# Patient Record
Sex: Female | Born: 1995 | Race: Black or African American | Hispanic: No | Marital: Single | Smoking: Never smoker
Health system: Southern US, Community
[De-identification: ages and names within clinical notes are randomized; demographics above are authoritative.]

## PROBLEM LIST (undated history)

## (undated) ENCOUNTER — Inpatient Hospital Stay (HOSPITAL_COMMUNITY): Payer: Self-pay

## (undated) DIAGNOSIS — F419 Anxiety disorder, unspecified: Secondary | ICD-10-CM

## (undated) DIAGNOSIS — T7840XA Allergy, unspecified, initial encounter: Secondary | ICD-10-CM

## (undated) DIAGNOSIS — K219 Gastro-esophageal reflux disease without esophagitis: Secondary | ICD-10-CM

## (undated) DIAGNOSIS — F32A Depression, unspecified: Secondary | ICD-10-CM

## (undated) HISTORY — DX: Depression, unspecified: F32.A

## (undated) HISTORY — DX: Anxiety disorder, unspecified: F41.9

---
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
## 2016-01-01 ENCOUNTER — Telehealth (HOSPITAL_BASED_OUTPATIENT_CLINIC_OR_DEPARTMENT_OTHER): Payer: Self-pay | Admitting: Emergency Medicine

## 2016-01-07 MED FILL — Fluorescein Sodium Ophth Strips 1 MG: OPHTHALMIC | Qty: 2 | Status: AC

## 2016-02-04 ENCOUNTER — Encounter (HOSPITAL_COMMUNITY): Payer: Self-pay

## 2016-02-04 ENCOUNTER — Emergency Department (HOSPITAL_COMMUNITY)
Admission: EM | Admit: 2016-02-04 | Discharge: 2016-02-04 | Disposition: A | Discharge status: Home / Self Care | Payer: Medicaid Other | Attending: Emergency Medicine | Admitting: Emergency Medicine

## 2016-02-04 DIAGNOSIS — Z87891 Personal history of nicotine dependence: Secondary | ICD-10-CM | POA: Insufficient documentation

## 2016-02-04 DIAGNOSIS — Z5321 Procedure and treatment not carried out due to patient leaving prior to being seen by health care provider: Secondary | ICD-10-CM | POA: Insufficient documentation

## 2016-02-04 DIAGNOSIS — R05 Cough: Secondary | ICD-10-CM | POA: Diagnosis not present

## 2016-02-04 DIAGNOSIS — R1110 Vomiting, unspecified: Secondary | ICD-10-CM | POA: Insufficient documentation

## 2016-02-04 LAB — COMPREHENSIVE METABOLIC PANEL
ALBUMIN: 3.8 g/dL (ref 3.5–5.0)
ALT: 26 U/L (ref 14–54)
ANION GAP: 15 (ref 5–15)
AST: 36 U/L (ref 15–41)
Alkaline Phosphatase: 79 U/L (ref 38–126)
BUN: 8 mg/dL (ref 6–20)
CO2: 21 mmol/L — AB (ref 22–32)
Calcium: 9.3 mg/dL (ref 8.9–10.3)
Chloride: 105 mmol/L (ref 101–111)
Creatinine, Ser: 0.88 mg/dL (ref 0.44–1.00)
GFR calc non Af Amer: >60 mL/min (ref >60)
GLUCOSE: 126 mg/dL — AB (ref 65–99)
POTASSIUM: 3.5 mmol/L (ref 3.5–5.1)
SODIUM: 141 mmol/L (ref 135–145)
TOTAL PROTEIN: 7.3 g/dL (ref 6.5–8.1)
Total Bilirubin: 0.6 mg/dL (ref 0.3–1.2)

## 2016-02-04 LAB — CBC
HEMATOCRIT: 41.6 % (ref 36.0–46.0)
HEMOGLOBIN: 13.1 g/dL (ref 12.0–15.0)
MCH: 27.5 pg (ref 26.0–34.0)
MCHC: 31.5 g/dL (ref 30.0–36.0)
MCV: 87.4 fL (ref 78.0–100.0)
Platelets: 335 10*3/uL (ref 150–400)
RBC: 4.76 MIL/uL (ref 3.87–5.11)
RDW: 14.4 % (ref 11.5–15.5)
WBC: 16.2 10*3/uL — ABNORMAL HIGH (ref 4.0–10.5)

## 2016-02-04 LAB — LIPASE, BLOOD: Lipase: 16 U/L (ref 11–51)

## 2016-02-04 NOTE — ED Notes (Signed)
Pts name called for a room no answer 

## 2016-02-04 NOTE — ED Triage Notes (Signed)
Pt reports cough that started today. She reports an episode of coughing up blood. She also reports one episode of emesis. Resp e/u.

## 2016-02-05 ENCOUNTER — Encounter (HOSPITAL_COMMUNITY): Payer: Self-pay | Admitting: Emergency Medicine

## 2016-02-05 ENCOUNTER — Emergency Department (HOSPITAL_COMMUNITY): Payer: Medicaid Other

## 2016-02-05 ENCOUNTER — Emergency Department (HOSPITAL_COMMUNITY)
Admission: EM | Admit: 2016-02-05 | Discharge: 2016-02-06 | Disposition: A | Discharge status: Home / Self Care | Payer: Medicaid Other | Attending: Emergency Medicine | Admitting: Emergency Medicine

## 2016-02-05 DIAGNOSIS — R079 Chest pain, unspecified: Secondary | ICD-10-CM | POA: Diagnosis present

## 2016-02-05 DIAGNOSIS — R042 Hemoptysis: Secondary | ICD-10-CM | POA: Diagnosis not present

## 2016-02-05 DIAGNOSIS — Z87891 Personal history of nicotine dependence: Secondary | ICD-10-CM | POA: Insufficient documentation

## 2016-02-05 DIAGNOSIS — R059 Cough, unspecified: Secondary | ICD-10-CM

## 2016-02-05 DIAGNOSIS — R05 Cough: Secondary | ICD-10-CM

## 2016-02-05 DIAGNOSIS — R0789 Other chest pain: Secondary | ICD-10-CM

## 2016-02-05 DIAGNOSIS — R1010 Upper abdominal pain, unspecified: Secondary | ICD-10-CM | POA: Insufficient documentation

## 2016-02-05 LAB — I-STAT TROPONIN, ED: TROPONIN I, POC: 0.01 ng/mL (ref 0.00–0.08)

## 2016-02-05 LAB — BASIC METABOLIC PANEL
Anion gap: 7 (ref 5–15)
BUN: 6 mg/dL (ref 6–20)
CALCIUM: 9.1 mg/dL (ref 8.9–10.3)
CO2: 25 mmol/L (ref 22–32)
Chloride: 106 mmol/L (ref 101–111)
Creatinine, Ser: 0.62 mg/dL (ref 0.44–1.00)
GFR calc Af Amer: >60 mL/min (ref >60)
Glucose, Bld: 95 mg/dL (ref 65–99)
POTASSIUM: 3.9 mmol/L (ref 3.5–5.1)
SODIUM: 138 mmol/L (ref 135–145)

## 2016-02-05 LAB — CBC
HEMATOCRIT: 42.4 % (ref 36.0–46.0)
Hemoglobin: 13.2 g/dL (ref 12.0–15.0)
MCH: 27.6 pg (ref 26.0–34.0)
MCHC: 31.1 g/dL (ref 30.0–36.0)
MCV: 88.7 fL (ref 78.0–100.0)
PLATELETS: 351 10*3/uL (ref 150–400)
RBC: 4.78 MIL/uL (ref 3.87–5.11)
RDW: 14.4 % (ref 11.5–15.5)
WBC: 13.4 10*3/uL — AB (ref 4.0–10.5)

## 2016-02-05 NOTE — ED Triage Notes (Signed)
Pt. reports central chest pain onset yesterday , denies SOB , no nausea or diaphoresis , occasional productive cough , no fever or chills.

## 2016-02-05 NOTE — ED Provider Notes (Signed)
[facility]-EMER[facility]Y DEPT Provider Note   CSN: [id number] Arrival date & time: 02/05/16  2046  By signing my name below, I, Suzan Slick. Elon Spanner, attest that this documentation has been prepared under the direction and in the presence of Arby Barrette, MD.  Electronically Signed: Suzan Slick. Elon Spanner, ED Scribe. 02/05/16. 11:56 PM.    History   Chief Complaint Chief Complaint  Patient presents with  . Chest Pain   The history is provided by the patient. No language interpreter was used.    HPI Comments: Beth Goodwin is a 20 y.o. female with a PMHx of GERD who presents to the Emergency Department complaining of constant, unchanged worsening lower thoracic back pain bilaterally that radiates around to her upper and central abdomen symmetrically x 2 days. Discomfort is made worse with deep breathing and coughing. No alleviating factors reported. She also reports an ongoing productive cough consisting of thick dark mucous and intermittent streaks of blood x 1 day. No lower extremity swelling or pain. Denies any fever, nausea, vomiting, diarrhea, leg swelling, vaginal bleeding, vaginal discharge. Pt was treated 1 month ago for an STI. States she completed treatment as prescribed. No preceding URI symptoms reported.  PCP: Alma Downs, MD    Past Medical History:  Diagnosis Date  . Allergy   . GERD (gastroesophageal reflux disease)     Patient Active Problem List   Diagnosis Date Noted  . Common bile duct dilatation   . Recurrent biliary colic   . Biliary colic 11/01/2014  . Abdominal pain 11/01/2014  . Postpartum state 09/13/2014  . [redacted] weeks gestation of pregnancy   . Non-reactive NST (non-stress test)   . NST (non-stress test) nonreactive   . [redacted] weeks gestation of pregnancy   . Insufficient weight gain during pregnancy in third trimester, antepartum   . Abdominal pain in pregnancy 06/09/2014  . Obesity affecting pregnancy in second trimester, antepartum   . Insufficient prenatal  care in second trimester 04/25/2014  . Encounter for supervision of normal pregnancy in teen primigravida, antepartum 04/25/2014    Past Surgical History:  Procedure Laterality Date  . CESAREAN SECTION Beth Goodwin 09/10/2014   Procedure: CESAREAN SECTION;  Surgeon: Catalina Antigua, MD;  Location: [facility];  Service: Obstetrics;  Laterality: Beth Goodwin;  . CHOLECYSTECTOMY Beth Goodwin 11/02/2014   Procedure: LAPAROSCOPIC CHOLECYSTECTOMY WITH INTRAOPERATIVE CHOLANGIOGRAM;  Surgeon: Chevis Pretty III, MD;  Location: [facility] OR;  Service: General;  Laterality: Beth Goodwin;    OB History    Gravida Para Term Preterm AB Living   1 1 1     1    SAB TAB Ectopic Multiple Live Births         0 1       Home Medications    Prior to Admission medications   Medication Sig Start Date End Date Taking? Authorizing Provider  etonogestrel (NEXPLANON) 68 MG IMPL implant 1 each by Subdermal route once.   Yes Historical Provider, MD  azithromycin (ZITHROMAX) 250 MG tablet Take 1 tablet (250 mg total) by mouth daily. 02/06/16   Arby Barrette, MD  ibuprofen (ADVIL,MOTRIN) 600 MG tablet Take 1 tablet (600 mg total) by mouth every 6 (six) hours as needed. 02/06/16   Arby Barrette, MD    Family History Family History  Problem Relation Age of Onset  . Hypertension Father   . Diabetes Maternal Aunt   . Diabetes Maternal Grandmother   . Diabetes Paternal Grandmother     Social History Social History  Substance Use Topics  . Smoking status:  Former Smoker  . Smokeless tobacco: Never Used  . Alcohol use No     Allergies   Amoxicillin   Review of Systems Review of Systems  Constitutional: Negative for fever.  Respiratory: Positive for cough.   Cardiovascular: Negative for leg swelling.  Gastrointestinal: Positive for abdominal pain.  Genitourinary: Negative for vaginal bleeding and vaginal discharge.  Musculoskeletal: Positive for back pain.  All other systems reviewed and are negative.    Physical Exam Updated Vital Signs BP  115/70   Pulse 94   Temp 98.1 F (36.7 C) (Oral)   Resp 26   Ht 5\' 5"  (1.651 m)   LMP 02/05/2015   SpO2 100%   Physical Exam  Constitutional: She is oriented to person, place, and time. She appears well-developed and well-nourished. No distress.  HENT:  Head: Normocephalic and atraumatic.  Mouth/Throat: Oropharynx is clear and moist. No oropharyngeal exudate or posterior oropharyngeal edema.  Tonsils are normal.  Eyes: EOM are normal.  Neck: Normal range of motion.  Cardiovascular: Normal rate, regular rhythm and normal heart sounds.   Pulmonary/Chest: Effort normal and breath sounds normal. She exhibits tenderness.  Chest wall tenderness noted to palpation. No crepitus.  Abdominal: Soft. She exhibits no distension. There is tenderness. There is no rebound and no guarding.  RUQ and LUQ tenderness noted.  Musculoskeletal: Normal range of motion. She exhibits tenderness.  Mid thoracic discomfort with percussion bilaterally. No calf swelling or tenderness noted.  Neurological: She is alert and oriented to person, place, and time.  Skin: Skin is warm and dry.  Psychiatric: She has a normal mood and affect. Judgment normal.  Nursing note and vitals reviewed.    ED Treatments / Results   DIAGNOSTIC STUDIES: Oxygen Saturation is 100% on RA, Normal by my interpretation.    COORDINATION OF CARE: 11:48 PM- Will order blood work, EKG, and CXR. Discussed treatment plan with pt at bedside and pt agreed to plan.     Labs (all labs ordered are listed, but only abnormal results are displayed) Labs Reviewed  CBC - Abnormal; Notable for the following:       Result Value   WBC 13.4 (*)    All other components within normal limits  HEPATIC FUNCTION PANEL - Abnormal; Notable for the following:    AST 45 (*)    Bilirubin, Direct <0.1 (*)    All other components within normal limits  URINALYSIS, ROUTINE W REFLEX MICROSCOPIC (NOT AT [facility]) - Abnormal; Notable for the following:     APPearance CLOUDY (*)    Hgb urine dipstick LARGE (*)    Bilirubin Urine SMALL (*)    Ketones, ur 15 (*)    All other components within normal limits  URINE MICROSCOPIC-ADD ON - Abnormal; Notable for the following:    Squamous Epithelial / LPF 0-5 (*)    Bacteria, UA RARE (*)    All other components within normal limits  BASIC METABOLIC PANEL  LIPASE, BLOOD  D-DIMER, QUANTITATIVE (NOT AT [facility])  I-STAT TROPOININ, ED  I-STAT BETA HCG BLOOD, ED ([facility], [facility], [facility] ONLY)    EKG  EKG Interpretation  Date/Time:  Wednesday February 05 2016 20:58:44 EDT Ventricular Rate:  88 PR Interval:  138 QRS Duration: 68 QT Interval:  360 QTC Calculation: 435 R Axis:   50 Text Interpretation:  Normal sinus rhythm normal ECG. no o ld comparison Confirmed by Donnald Garre, MD, Lebron Conners [phone]) on 02/05/2016 11:54:57 PM       Radiology Dg Chest 2  View  Result Date: 02/05/2016 CLINICAL DATA:  Productive cough x 1 day. Pain below left breast with no SOB. Pt. Has been coughing up dark red (brownish) Blood today.Former smoker 4 years - 1 cigar a day. EXAM: CHEST  2 VIEW COMPARISON:  None. FINDINGS: The heart size and mediastinal contours are within normal limits. Both lungs are clear. No pleural effusion or pneumothorax. The visualized skeletal structures are unremarkable. IMPRESSION: No active cardiopulmonary disease. Electronically Signed   By: David  Ormond M.D.   On: 02/05/2016 21:50   Ct Abdomen Pelvis W Contrast  Result Date: 02/06/2016 CLINICAL DATA:  19 year old female with upper abdominal pain. EXAM: CT ABDOMEN AND PELVIS WITH CONTRAST TECHNIQUE: Multidetector CT imaging of the abdomen and pelvis was performed using the standard protocol following bolus administration of intravenous contrast. CONTRAST:  <[phone]<MEASUREMENTMa<MEASUREMEN<MEASUREMENTMaisieIl<MEASUREMENTMaisieIllinois954Prospect B<MEASUREMENTMaisieIllinois303 452 61[facility] <MEASUREMEN<MEASUREMENTMa<MEASUREMENTMaisieIllino<MEAS[phone]<MEASUREMENTMai<MEASURE<MEASUREMENTMaisieIllinois([phone]<MEASUREMENTMaisieIllino<MEAS<MEASUREMENTMaisieIllinois513 [facility]>6 abdominal CT dated 10/25/2014 an MRI dated 11/03/2014 FINDINGS: Lower chest: The visualized lung bases are  clear. No intra-abdominal free air or free fluid. Hepatobiliary: Cholecystectomy. There is no intrahepatic biliary ductal dilatation. No retained stone noted in the central CBD. The liver is unremarkable. Pancreas: Unremarkable. No pancreatic ductal dilatation or surrounding inflammatory changes. Spleen: Normal in size without focal abnormality. Adrenals/Urinary Tract: Adrenal glands are unremarkable. Kidneys are normal, without renal calculi, focal lesion, or hydronephrosis. Bladder is collapsed. Stomach/Bowel: Stomach is within normal limits. Appendix appears normal. No evidence of bowel wall thickening, distention, or inflammatory changes. Vascular/Lymphatic: No significant vascular findings are present. No enlarged abdominal or pelvic lymph nodes. Reproductive: There is a 2.5 x 2.4 cm right ovarian dominant follicle/ cyst. The uterus is unremarkable. Other: None Musculoskeletal: No acute or significant osseous findings. IMPRESSION: No acute intra-abdominal or pelvic pathology. Cholecystectomy. No retained stone identified in the common bile duct. A 2.5 cm right ovarian dominant follicle/cyst. Electronically Signed   By: Arash  Radparvar M.D.   On: 02/06/2016 03:41    Procedures Procedures (including critical care time)  Medications Ordered in ED Medications  iopamidol (ISOVUE-300) 61 % injection (not administered)  ketorolac (TORADOL) 30 MG/ML injection 30 mg (30 mg Intravenous Given 02/06/16 0049)  iopamidol (ISOVUE-300) 61 % injection 100 mL (100 mLs Intravenous Contrast Given 02/06/16 0303)  azithromycin (ZITHROMAX) tablet 500 mg (500 mg Oral Given 02/06/16 0425)     Initial Impression / Assessment and Plan / ED Course  I have reviewed the triage vital signs and the nursing notes.  Pertinent labs & imaging results that were available during my care of the patient were reviewed by me and considered in my medical decision making (see chart for details).  Clinical Course    Final Clinical  Impressions(s) / ED Diagnoses   Final diagnoses:  Cough  Atypical chest pain  Hemoptysis  Patient's pain was symmetric in the lower thoracic and upper abdomen. She did have reproducible abdominal pain in the left and right upper quadrants. CT scan is negative for acute intra-abdominal findings. D-dimer is within normal limits. Patient does not have lower extremity symptoms and pain is atypical for PE. At this time, with report of brown and yellow mucous and blood tinged sputum, I will opt to treat with antibiotics. Patient was treated with Zithromax. She is alert and well in appearance. She exhibits no respiratory distress. Pulmonary auscultation is normal without wheezing. I do if she feel she is appropriate to start outpatient treatment for possible atypical pneumonia.  New Prescriptions New Prescriptions   AZITHROMYCIN (ZITHROMAX) 250 MG TABLET  Take 1 tablet (250 mg total) by mouth daily.   IBUPROFEN (ADVIL,MOTRIN) 600 MG TABLET    Take 1 tablet (600 mg total) by mouth every 6 (six) hours as needed.      Arby BarretteMarcy Octavious Zidek, MD 02/06/16 0430

## 2016-02-06 ENCOUNTER — Encounter (HOSPITAL_COMMUNITY): Payer: Self-pay

## 2016-02-06 ENCOUNTER — Emergency Department (HOSPITAL_COMMUNITY): Payer: Medicaid Other

## 2016-02-06 LAB — URINE MICROSCOPIC-ADD ON: WBC UA: NONE SEEN WBC/hpf (ref 0–5)

## 2016-02-06 LAB — LIPASE, BLOOD: Lipase: 17 U/L (ref 11–51)

## 2016-02-06 LAB — HEPATIC FUNCTION PANEL
ALBUMIN: 3.7 g/dL (ref 3.5–5.0)
ALK PHOS: 74 U/L (ref 38–126)
ALT: 32 U/L (ref 14–54)
AST: 45 U/L — AB (ref 15–41)
Bilirubin, Direct: <0.1 mg/dL — ABNORMAL LOW (ref 0.1–0.5)
TOTAL PROTEIN: 7.3 g/dL (ref 6.5–8.1)
Total Bilirubin: 0.6 mg/dL (ref 0.3–1.2)

## 2016-02-06 LAB — URINALYSIS, ROUTINE W REFLEX MICROSCOPIC
GLUCOSE, UA: NEGATIVE mg/dL
Ketones, ur: 15 mg/dL — AB
Leukocytes, UA: NEGATIVE
Nitrite: NEGATIVE
PH: 6.5 (ref 5.0–8.0)
Protein, ur: NEGATIVE mg/dL
SPECIFIC GRAVITY, URINE: 1.03 (ref 1.005–1.030)

## 2016-02-06 LAB — I-STAT BETA HCG BLOOD, ED (MC, WL, AP ONLY): I-stat hCG, quantitative: <5 m[IU]/mL (ref <5)

## 2016-02-06 LAB — D-DIMER, QUANTITATIVE (NOT AT ARMC)

## 2016-02-06 MED ORDER — AZITHROMYCIN 250 MG PO TABS
500.0000 mg | ORAL_TABLET | Freq: Once | ORAL | Status: AC
  Administered 2016-02-06: 500 mg via ORAL
  Filled 2016-02-06: qty 2

## 2016-02-06 MED ORDER — IBUPROFEN 600 MG PO TABS: 600.0000 mg | ORAL_TABLET | Freq: Four times a day (QID) | ORAL | 0 refills | Status: DC | PRN

## 2016-02-06 MED ORDER — IOPAMIDOL (ISOVUE-300) INJECTION 61%
100.0000 mL | Freq: Once | INTRAVENOUS | Status: AC | PRN
  Administered 2016-02-06: 100 mL via INTRAVENOUS

## 2016-02-06 MED ORDER — KETOROLAC TROMETHAMINE 30 MG/ML IJ SOLN
30.0000 mg | Freq: Once | INTRAMUSCULAR | Status: AC
  Administered 2016-02-06: 30 mg via INTRAVENOUS
  Filled 2016-02-06: qty 1

## 2016-02-06 MED ORDER — AZITHROMYCIN 250 MG PO TABS: 250.0000 mg | ORAL_TABLET | Freq: Every day | ORAL | 0 refills | Status: DC

## 2016-02-06 MED ORDER — IOPAMIDOL (ISOVUE-300) INJECTION 61%
INTRAVENOUS | Status: AC
  Filled 2016-02-06: qty 100

## 2016-03-03 ENCOUNTER — Telehealth (HOSPITAL_BASED_OUTPATIENT_CLINIC_OR_DEPARTMENT_OTHER): Payer: Self-pay | Admitting: Emergency Medicine

## 2016-03-03 NOTE — Telephone Encounter (Signed)
Lost to followup 

## 2016-05-23 ENCOUNTER — Emergency Department (HOSPITAL_COMMUNITY)
Admission: EM | Admit: 2016-05-23 | Discharge: 2016-05-23 | Disposition: A | Discharge status: Home / Self Care | Payer: Medicaid Other | Attending: Emergency Medicine | Admitting: Emergency Medicine

## 2016-05-23 ENCOUNTER — Encounter (HOSPITAL_COMMUNITY): Payer: Self-pay | Admitting: Emergency Medicine

## 2016-05-23 DIAGNOSIS — R1084 Generalized abdominal pain: Secondary | ICD-10-CM | POA: Diagnosis present

## 2016-05-23 DIAGNOSIS — Z87891 Personal history of nicotine dependence: Secondary | ICD-10-CM | POA: Diagnosis not present

## 2016-05-23 DIAGNOSIS — K529 Noninfective gastroenteritis and colitis, unspecified: Secondary | ICD-10-CM | POA: Insufficient documentation

## 2016-05-23 DIAGNOSIS — Z79899 Other long term (current) drug therapy: Secondary | ICD-10-CM | POA: Insufficient documentation

## 2016-05-23 LAB — CBC WITH DIFFERENTIAL/PLATELET
BASOS PCT: 0 %
Basophils Absolute: 0 10*3/uL (ref 0.0–0.1)
Eosinophils Absolute: 0.1 10*3/uL (ref 0.0–0.7)
Eosinophils Relative: 0 %
HEMATOCRIT: 41.2 % (ref 36.0–46.0)
HEMOGLOBIN: 13.2 g/dL (ref 12.0–15.0)
LYMPHS PCT: 16 %
Lymphs Abs: 2.1 10*3/uL (ref 0.7–4.0)
MCH: 27.4 pg (ref 26.0–34.0)
MCHC: 32 g/dL (ref 30.0–36.0)
MCV: 85.7 fL (ref 78.0–100.0)
MONOS PCT: 3 %
Monocytes Absolute: 0.4 10*3/uL (ref 0.1–1.0)
NEUTROS ABS: 10.5 10*3/uL — AB (ref 1.7–7.7)
NEUTROS PCT: 81 %
Platelets: 362 10*3/uL (ref 150–400)
RBC: 4.81 MIL/uL (ref 3.87–5.11)
RDW: 13.4 % (ref 11.5–15.5)
WBC: 13.1 10*3/uL — ABNORMAL HIGH (ref 4.0–10.5)

## 2016-05-23 LAB — COMPREHENSIVE METABOLIC PANEL
ALBUMIN: 3.2 g/dL — AB (ref 3.5–5.0)
ALK PHOS: 72 U/L (ref 38–126)
ALT: 19 U/L (ref 14–54)
ANION GAP: 7 (ref 5–15)
AST: 20 U/L (ref 15–41)
BUN: 7 mg/dL (ref 6–20)
CALCIUM: 8.8 mg/dL — AB (ref 8.9–10.3)
CHLORIDE: 105 mmol/L (ref 101–111)
CO2: 25 mmol/L (ref 22–32)
Creatinine, Ser: 0.65 mg/dL (ref 0.44–1.00)
GFR calc non Af Amer: >60 mL/min (ref >60)
Glucose, Bld: 135 mg/dL — ABNORMAL HIGH (ref 65–99)
POTASSIUM: 4.2 mmol/L (ref 3.5–5.1)
SODIUM: 137 mmol/L (ref 135–145)
Total Bilirubin: 0.5 mg/dL (ref 0.3–1.2)
Total Protein: 7 g/dL (ref 6.5–8.1)

## 2016-05-23 LAB — URINALYSIS, ROUTINE W REFLEX MICROSCOPIC
Bilirubin Urine: NEGATIVE
Glucose, UA: NEGATIVE mg/dL
Hgb urine dipstick: NEGATIVE
KETONES UR: NEGATIVE mg/dL
LEUKOCYTES UA: NEGATIVE
NITRITE: NEGATIVE
PH: 6 (ref 5.0–8.0)
Protein, ur: NEGATIVE mg/dL
Specific Gravity, Urine: 1.02 (ref 1.005–1.030)

## 2016-05-23 LAB — LIPASE, BLOOD: LIPASE: 19 U/L (ref 11–51)

## 2016-05-23 LAB — PREGNANCY, URINE: Preg Test, Ur: NEGATIVE

## 2016-05-23 MED ORDER — OXYCODONE-ACETAMINOPHEN 5-325 MG PO TABS
1.0000 | ORAL_TABLET | ORAL | Status: DC | PRN
  Administered 2016-05-23: 1 via ORAL
  Filled 2016-05-23: qty 1

## 2016-05-23 MED ORDER — DICYCLOMINE HCL 10 MG PO CAPS
10.0000 mg | ORAL_CAPSULE | Freq: Once | ORAL | Status: AC
  Administered 2016-05-23: 10 mg via ORAL
  Filled 2016-05-23: qty 1

## 2016-05-23 MED ORDER — ONDANSETRON HCL 4 MG PO TABS: 4.0000 mg | ORAL_TABLET | Freq: Four times a day (QID) | ORAL | 0 refills | Status: DC

## 2016-05-23 MED ORDER — DICYCLOMINE HCL 20 MG PO TABS: 20.0000 mg | ORAL_TABLET | Freq: Two times a day (BID) | ORAL | 0 refills | Status: DC

## 2016-05-23 MED ORDER — KETOROLAC TROMETHAMINE 30 MG/ML IJ SOLN
30.0000 mg | Freq: Once | INTRAMUSCULAR | Status: AC
  Administered 2016-05-23: 30 mg via INTRAMUSCULAR
  Filled 2016-05-23: qty 1

## 2016-05-23 MED ORDER — OXYCODONE-ACETAMINOPHEN 5-325 MG PO TABS
ORAL_TABLET | ORAL | Status: AC
  Filled 2016-05-23: qty 1

## 2016-05-23 MED ORDER — HYDROCODONE-ACETAMINOPHEN 5-325 MG PO TABS: 1.0000 | ORAL_TABLET | ORAL | 0 refills | Status: DC | PRN

## 2016-05-23 MED ORDER — ONDANSETRON 4 MG PO TBDP
ORAL_TABLET | ORAL | Status: AC
  Filled 2016-05-23: qty 1

## 2016-05-23 MED ORDER — ONDANSETRON 4 MG PO TBDP
4.0000 mg | ORAL_TABLET | Freq: Once | ORAL | Status: AC | PRN
  Administered 2016-05-23: 4 mg via ORAL

## 2016-05-23 NOTE — ED Provider Notes (Signed)
[facility] Provider Note   CSN: [id number] Arrival date & time: 05/23/16  0720   History   Chief Complaint Chief Complaint  Patient presents with  . Abdominal Pain    HPI Beth Goodwin is a 21 y.o. female.  HPI    Patient to the ER with PMH of allergies, GERD, cholecystectomy with complaints of diffuse abdominal pain that is crampy and intermittent. It woke up her up out of her sleep around 3:30 am and would be accompanied by 6 episodes of non bloody diarrhea over the next 5 hours. She is no longer having diarrhea but is still having abdominal cramping. She is nauseous but has not vomited. She does not have back pain, no dysuria, vaginal discharge/bleeding, fevers, weakness, le swelling. Rates her symptoms at an 8/10 at its worst.  Past Medical History:  Diagnosis Date  . Allergy   . GERD (gastroesophageal reflux disease)     Patient Active Problem List   Diagnosis Date Noted  . Common bile duct dilatation   . Recurrent biliary colic   . Biliary colic 11/01/2014  . Abdominal pain 11/01/2014  . Postpartum state 09/13/2014  . [redacted] weeks gestation of pregnancy   . Non-reactive NST (non-stress test)   . NST (non-stress test) nonreactive   . [redacted] weeks gestation of pregnancy   . Insufficient weight gain during pregnancy in third trimester, antepartum   . Abdominal pain in pregnancy 06/09/2014  . Obesity affecting pregnancy in second trimester, antepartum   . Insufficient prenatal care in second trimester 04/25/2014  . Encounter for supervision of normal pregnancy in teen primigravida, antepartum 04/25/2014    Past Surgical History:  Procedure Laterality Date  . CESAREAN SECTION N/A 09/10/2014   Procedure: CESAREAN SECTION;  Surgeon: Catalina AntiguaPeggy Constant, MD;  Location: [facility];  Service: Obstetrics;  Laterality: N/A;  . CHOLECYSTECTOMY N/A 11/02/2014   Procedure: LAPAROSCOPIC CHOLECYSTECTOMY WITH INTRAOPERATIVE CHOLANGIOGRAM;  Surgeon: Chevis PrettyPaul Toth III, MD;  Location: [facility] OR;   Service: General;  Laterality: N/A;    OB History    Gravida Para Term Preterm AB Living   1 1 1     1    SAB TAB Ectopic Multiple Live Births         0 1       Home Medications    Prior to Admission medications   Medication Sig Start Date End Date Taking? Authorizing Provider  etonogestrel (NEXPLANON) 68 MG IMPL implant 1 each by Subdermal route once.   Yes Historical Provider, MD  azithromycin (ZITHROMAX) 250 MG tablet Take 1 tablet (250 mg total) by mouth daily. Patient not taking: Reported on 05/23/2016 02/06/16   Arby BarretteMarcy Pfeiffer, MD  dicyclomine (BENTYL) 20 MG tablet Take 1 tablet (20 mg total) by mouth 2 (two) times daily. 05/23/16   Marlon Peliffany Yousef Huge, PA-C  HYDROcodone-acetaminophen (NORCO/VICODIN) 5-325 MG tablet Take 1-2 tablets by mouth every 4 (four) hours as needed. 05/23/16   Ishanvi Mcquitty Neva SeatGreene, PA-C  ibuprofen (ADVIL,MOTRIN) 600 MG tablet Take 1 tablet (600 mg total) by mouth every 6 (six) hours as needed. Patient not taking: Reported on 05/23/2016 02/06/16   Arby BarretteMarcy Pfeiffer, MD  ondansetron (ZOFRAN) 4 MG tablet Take 1 tablet (4 mg total) by mouth every 6 (six) hours. 05/23/16   Marlon Peliffany Romy Mcgue, PA-C    Family History Family History  Problem Relation Age of Onset  . Hypertension Father   . Diabetes Maternal Aunt   . Diabetes Maternal Grandmother   . Diabetes Paternal Grandmother  Social History Social History  Substance Use Topics  . Smoking status: Former Games developer  . Smokeless tobacco: Never Used  . Alcohol use No     Allergies   Amoxicillin   Review of Systems Review of Systems Review of Systems All other systems negative except as documented in the HPI. All pertinent positives and negatives as reviewed in the HPI.   Physical Exam Updated Vital Signs BP 128/84   Pulse 74   Temp 98.1 F (36.7 C)   Resp 18   Ht 5\' 5"  (1.651 m)   SpO2 99%   Physical Exam  Constitutional: She appears well-developed and well-nourished.  HENT:  Head: Normocephalic and  atraumatic.  Eyes: Conjunctivae are normal. Pupils are equal, round, and reactive to light.  Neck: Trachea normal, normal range of motion and full passive range of motion without pain. Neck supple.  Cardiovascular: Normal rate, regular rhythm and normal pulses.   Pulmonary/Chest: Effort normal and breath sounds normal. Chest wall is not dull to percussion. She exhibits no tenderness, no crepitus, no edema, no deformity and no retraction.  Abdominal: Soft. Normal appearance and bowel sounds are normal. She exhibits no distension and no ascites. There is no tenderness. There is no rigidity, no rebound, no guarding, no CVA tenderness, no tenderness at McBurney's point and negative Murphy's sign.  Musculoskeletal: Normal range of motion.  Neurological: She is alert. She has normal strength.  Skin: Skin is warm, dry and intact.  Psychiatric: She has a normal mood and affect. Her speech is normal and behavior is normal. Judgment and thought content normal. Cognition and memory are normal.    ED Treatments / Results  Labs (all labs ordered are listed, but only abnormal results are displayed) Labs Reviewed  COMPREHENSIVE METABOLIC PANEL - Abnormal; Notable for the following:       Result Value   Glucose, Bld 135 (*)    Calcium 8.8 (*)    Albumin 3.2 (*)    All other components within normal limits  URINALYSIS, ROUTINE W REFLEX MICROSCOPIC - Abnormal; Notable for the following:    APPearance HAZY (*)    All other components within normal limits  CBC WITH DIFFERENTIAL/PLATELET - Abnormal; Notable for the following:    WBC 13.1 (*)    Neutro Abs 10.5 (*)    All other components within normal limits  LIPASE, BLOOD  PREGNANCY, URINE    EKG  EKG Interpretation None       Radiology No results found.  Procedures Procedures (including critical care time)  Medications Ordered in ED Medications  oxyCODONE-acetaminophen (PERCOCET/ROXICET) 5-325 MG per tablet 1 tablet (1 tablet Oral Given  05/23/16 0743)  ondansetron (ZOFRAN-ODT) disintegrating tablet 4 mg (4 mg Oral Given 05/23/16 0735)  dicyclomine (BENTYL) capsule 10 mg (10 mg Oral Given 05/23/16 0826)  ketorolac (TORADOL) 30 MG/ML injection 30 mg (30 mg Intramuscular Given 05/23/16 1610)     Initial Impression / Assessment and Plan / ED Course  I have reviewed the triage vital signs and the nursing notes.  Pertinent labs & imaging results that were available during my care of the patient were reviewed by me and considered in my medical decision making (see chart for details).  Clinical Course     Patient with symptoms consistent with viral gastroenteritis.  Vitals are stable, no fever.  No signs of dehydration, tolerating PO fluids > 6 oz.  Lungs are clear.  No focal abdominal pain, no concern for appendicitis, cholecystitis, pancreatitis, ruptured viscus, UTI,  kidney stone, or any other abdominal etiology.  Supportive therapy indicated with return if symptoms worsen.  Patient counseled.   Final Clinical Impressions(s) / ED Diagnoses   Final diagnoses:  Gastroenteritis    New Prescriptions New Prescriptions   DICYCLOMINE (BENTYL) 20 MG TABLET    Take 1 tablet (20 mg total) by mouth 2 (two) times daily.   HYDROCODONE-ACETAMINOPHEN (NORCO/VICODIN) 5-325 MG TABLET    Take 1-2 tablets by mouth every 4 (four) hours as needed.   ONDANSETRON (ZOFRAN) 4 MG TABLET    Take 1 tablet (4 mg total) by mouth every 6 (six) hours.     Marlon Pel, PA-C 05/23/16 [phone]

## 2016-05-23 NOTE — ED Triage Notes (Signed)
Per EMS/Pt- reports new onset lower abdominal pain starting at 330. Pt states a little worse than a cramp, pain comes and goes. Pt also has had 6 episodes of diarrhea and also has nausea. Denies fevers. Pt has birth control implant, no period in 2 years. Pt had a baby in 2016, baby passed away.

## 2016-05-23 NOTE — ED Notes (Signed)
Pt crying in pain. Triage protocol for pain placed.

## 2016-11-16 IMAGING — US US OB FOLLOW-UP
2 series · 12 of 28 positions shown · non-contrast
Comparison: none

[Series 1: us ob follow up · 8 of 25 slices shown (1 of 2)]
[im 2/25]
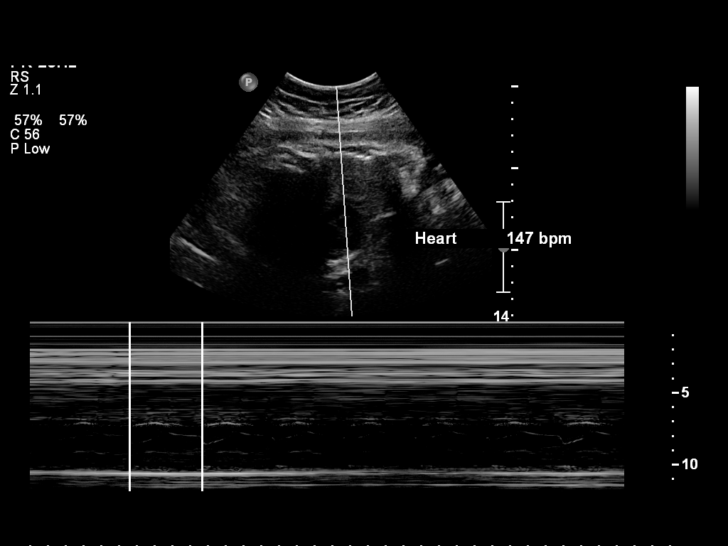
[im 5/25]
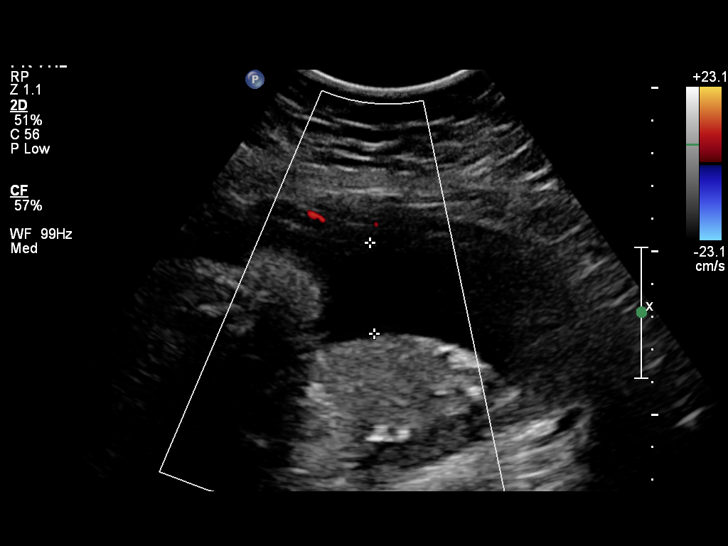
[im 7/25]
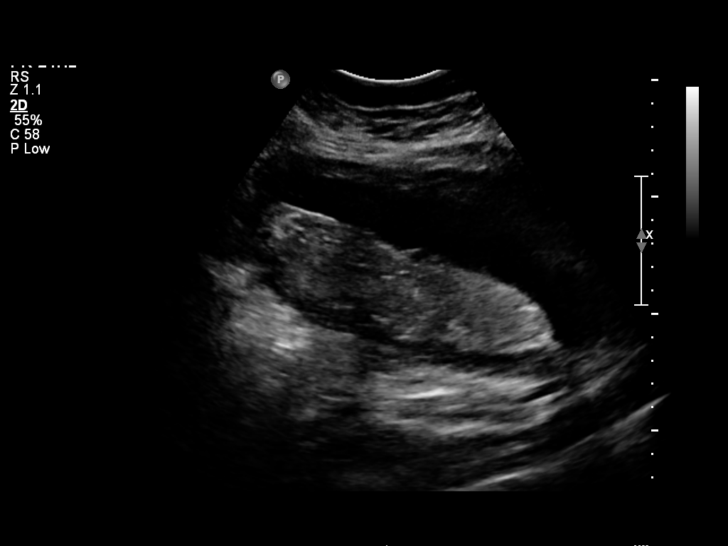
[im 11/25]
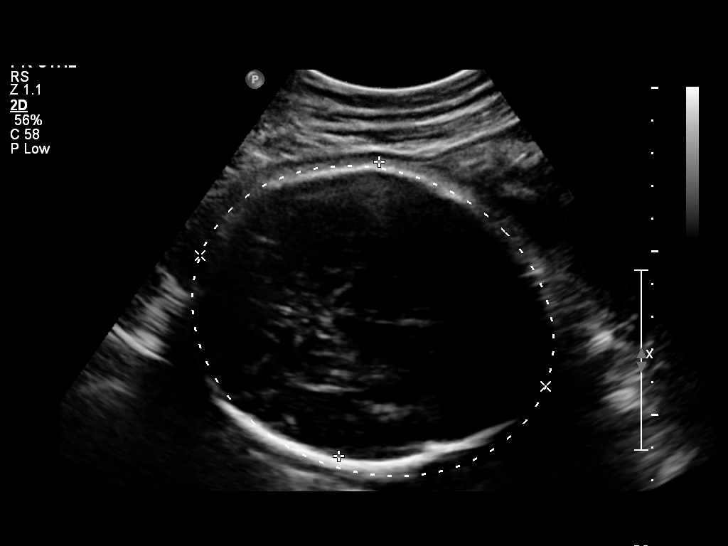
[im 14/25]
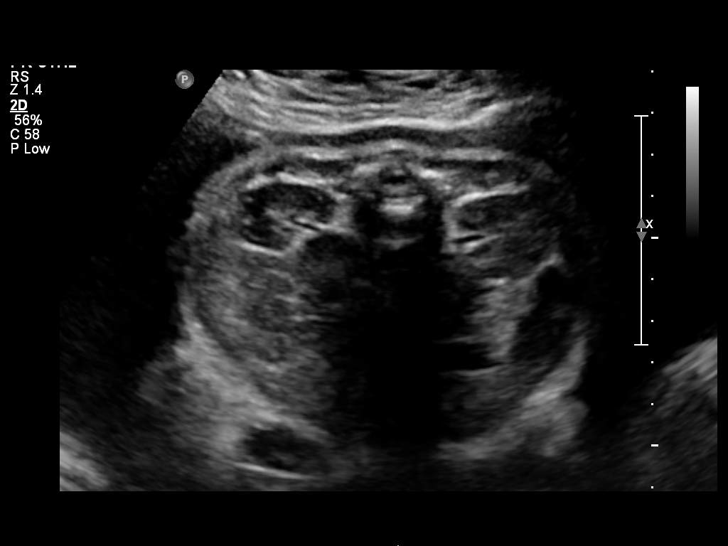
[im 17/25]
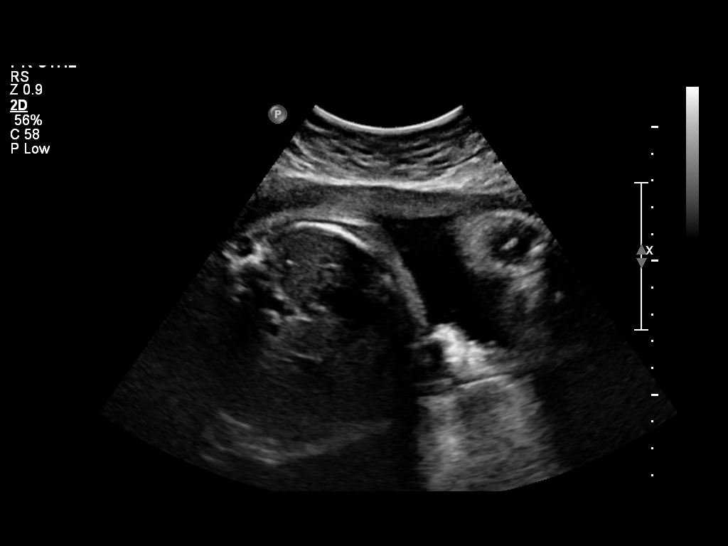
[im 21/25]
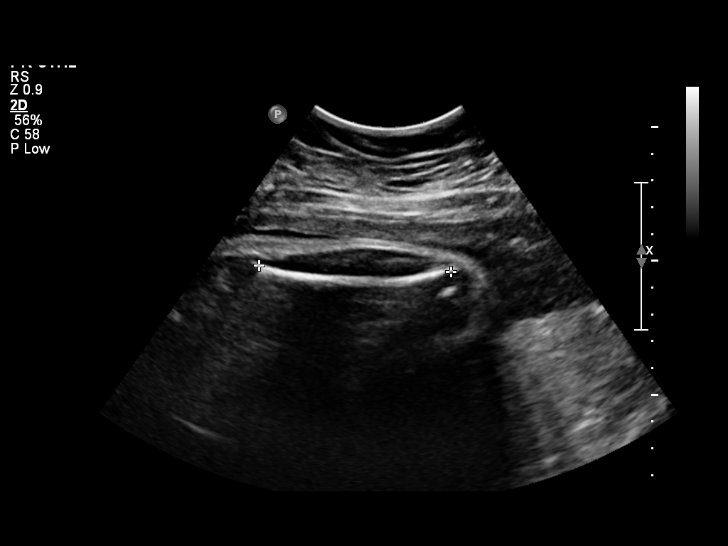
[im 23/25]
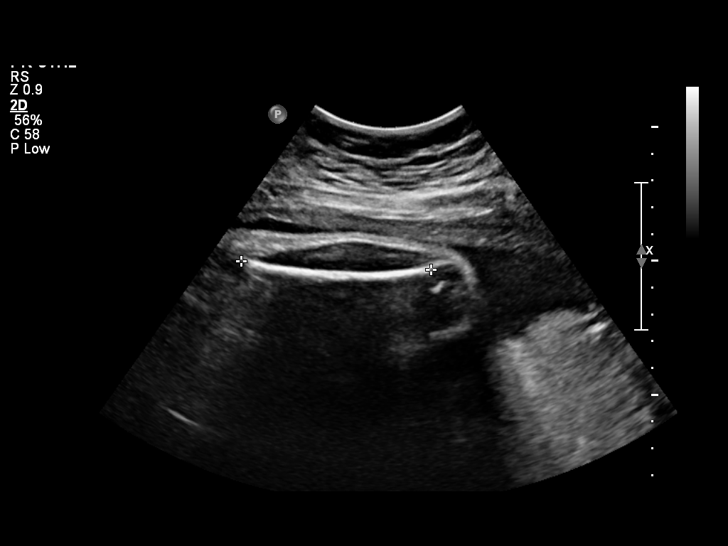

[Series 1: us ob follow up · 12 acquisitions, 4 frames shown (2 of 2)]
[im 1/12]
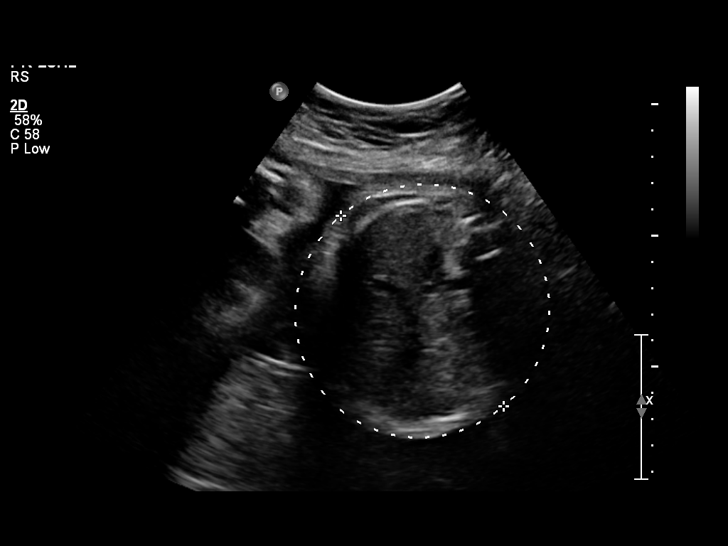
[im 5/12]
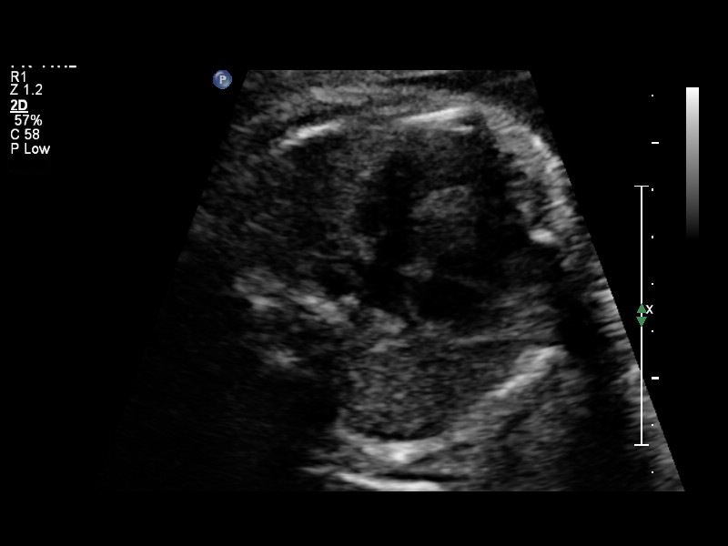
[im 7/12]
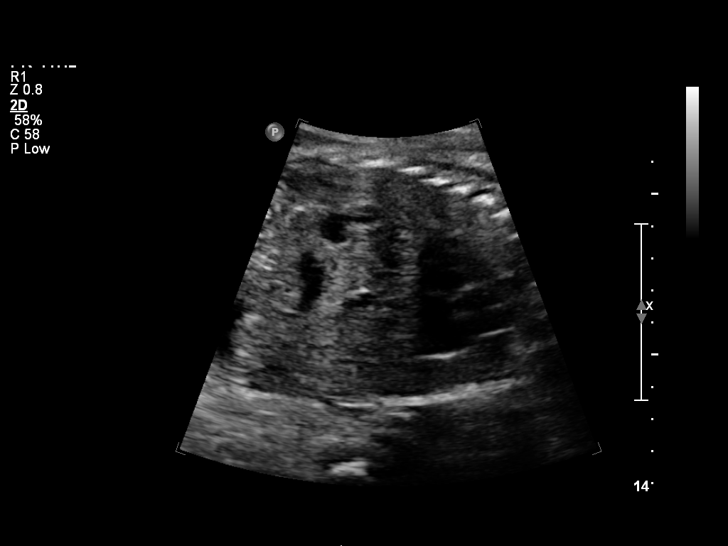
[im 10/12]
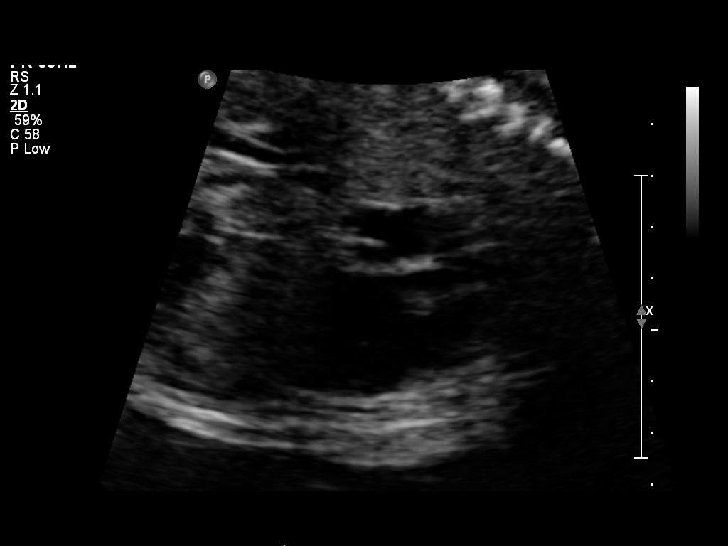

[12 of 28 positions shown; findings below may reference images not displayed]

OBSTETRICS REPORT
                      (Signed Final 08/14/2014 [DATE])

Service(s) Provided

 US OB FOLLOW UP                                       76816.1
Indications

 Teen pregnancy
 Marijuana Abuse
 Obesity complicating pregnancy
 No or Little Prenatal Care
 35 weeks gestation of pregnancy
 Poor weight gain
Fetal Evaluation

 Num Of Fetuses:    1
 Fetal Heart Rate:  147                          bpm
 Cardiac Activity:  Observed
 Presentation:      Cephalic
 Placenta:          Posterior, above cervical
                    os
 P. Cord            Previously Visualized
 Insertion:

 Amniotic Fluid
 AFI FV:      Subjectively within normal limits
 AFI Sum:     15.66   cm       57  %Tile     Larg Pckt:     7.3  cm
 RUQ:   7.3     cm   RLQ:    3.25   cm    LUQ:   2.34    cm   LLQ:    2.77   cm
Biometry

 BPD:     90.4  mm     G. Age:  36w 5d                CI:        78.69   70 - 86
                                                      FL/HC:      22.1   20.1 -

 HC:     322.3  mm     G. Age:  36w 3d       39  %    HC/AC:      1.07   0.93 -

 AC:     302.2  mm     G. Age:  34w 2d       21  %    FL/BPD:     78.9   71 - 87
 FL:      71.3  mm     G. Age:  36w 4d       69  %    FL/AC:      23.6   20 - 24

 Est. FW:    2436  gm    5 lb 13 oz      58  %
Gestational Age

 LMP:           35w 4d        Date:  12/08/13                 EDD:   09/14/14
 U/S Today:     36w 0d                                        EDD:   09/11/14
 Best:          35w 4d     Det. By:  LMP  (12/08/13)          EDD:   09/14/14
Anatomy

 Cranium:          Appears normal         Aortic Arch:      Appears normal
 Fetal Cavum:      Previously seen        Ductal Arch:      Appears normal
 Ventricles:       Previously seen        Diaphragm:        Previously seen
 Choroid Plexus:   Previously seen        Stomach:          Appears normal, left
                                                            sided
 Cerebellum:       Previously seen        Abdomen:          Appears normal
 Posterior Fossa:  Previously seen        Abdominal Wall:   Previously seen
 Nuchal Fold:      Not applicable (>20    Cord Vessels:     Previously seen
                   wks GA)
 Face:             Orbits and profile     Kidneys:          Appear normal
                   previously seen
 Lips:             Previously seen        Bladder:          Appears normal
 Heart:            Appears normal         Spine:            Previously seen
                   (4CH, axis, and
                   situs)
 RVOT:             Not well visualized    Lower             Previously seen
                                          Extremities:
 LVOT:             Appears normal         Upper             Previously seen
                                          Extremities:

 Other:  Fetus appears to be a female. Heels previously visualized.
         Technically difficult due to maternal habitus and fetal position.
Cervix Uterus Adnexa

 Cervix:       Not visualized (advanced GA >13wks)
Impression

 Single IUP at 35w 4d
 Normal interval anatomy; somewhat limited views of the fetal
 heart again obtained (RVOT)
 Normal interval growth (58th %tile)
 Posterior placenta without previa
 Normal amniotic fluid volume
Recommendations

 Follow-up ultrasounds as clinically indicated.

## 2016-11-28 ENCOUNTER — Encounter (HOSPITAL_COMMUNITY): Payer: Self-pay | Admitting: Emergency Medicine

## 2016-11-28 ENCOUNTER — Emergency Department (HOSPITAL_COMMUNITY)
Admission: EM | Admit: 2016-11-28 | Discharge: 2016-11-28 | Disposition: A | Discharge status: Home / Self Care | Payer: Medicaid Other | Attending: Emergency Medicine | Admitting: Emergency Medicine

## 2016-11-28 DIAGNOSIS — N939 Abnormal uterine and vaginal bleeding, unspecified: Secondary | ICD-10-CM | POA: Insufficient documentation

## 2016-11-28 DIAGNOSIS — A599 Trichomoniasis, unspecified: Secondary | ICD-10-CM

## 2016-11-28 DIAGNOSIS — N644 Mastodynia: Secondary | ICD-10-CM | POA: Insufficient documentation

## 2016-11-28 DIAGNOSIS — N72 Inflammatory disease of cervix uteri: Secondary | ICD-10-CM | POA: Diagnosis not present

## 2016-11-28 DIAGNOSIS — R1030 Lower abdominal pain, unspecified: Secondary | ICD-10-CM | POA: Diagnosis present

## 2016-11-28 DIAGNOSIS — Z87891 Personal history of nicotine dependence: Secondary | ICD-10-CM | POA: Insufficient documentation

## 2016-11-28 LAB — COMPREHENSIVE METABOLIC PANEL
ALBUMIN: 3.6 g/dL (ref 3.5–5.0)
ALK PHOS: 78 U/L (ref 38–126)
ALT: 18 U/L (ref 14–54)
ANION GAP: 10 (ref 5–15)
AST: 21 U/L (ref 15–41)
BUN: 6 mg/dL (ref 6–20)
CALCIUM: 8.9 mg/dL (ref 8.9–10.3)
CHLORIDE: 104 mmol/L (ref 101–111)
CO2: 22 mmol/L (ref 22–32)
Creatinine, Ser: 0.53 mg/dL (ref 0.44–1.00)
GFR calc non Af Amer: >60 mL/min (ref >60)
GLUCOSE: 96 mg/dL (ref 65–99)
Potassium: 3.8 mmol/L (ref 3.5–5.1)
SODIUM: 136 mmol/L (ref 135–145)
Total Bilirubin: 0.8 mg/dL (ref 0.3–1.2)
Total Protein: 7.2 g/dL (ref 6.5–8.1)

## 2016-11-28 LAB — URINALYSIS, ROUTINE W REFLEX MICROSCOPIC
BACTERIA UA: NONE SEEN
Bilirubin Urine: NEGATIVE
GLUCOSE, UA: NEGATIVE mg/dL
KETONES UR: NEGATIVE mg/dL
Nitrite: NEGATIVE
PROTEIN: NEGATIVE mg/dL
Specific Gravity, Urine: 1.018 (ref 1.005–1.030)
pH: 8 (ref 5.0–8.0)

## 2016-11-28 LAB — WET PREP, GENITAL
SPERM: NONE SEEN
YEAST WET PREP: NONE SEEN

## 2016-11-28 LAB — LIPASE, BLOOD: LIPASE: 17 U/L (ref 11–51)

## 2016-11-28 LAB — CBC
HEMATOCRIT: 40.4 % (ref 36.0–46.0)
HEMOGLOBIN: 12.9 g/dL (ref 12.0–15.0)
MCH: 27.6 pg (ref 26.0–34.0)
MCHC: 31.9 g/dL (ref 30.0–36.0)
MCV: 86.5 fL (ref 78.0–100.0)
Platelets: 331 10*3/uL (ref 150–400)
RBC: 4.67 MIL/uL (ref 3.87–5.11)
RDW: 13.6 % (ref 11.5–15.5)
WBC: 9.7 10*3/uL (ref 4.0–10.5)

## 2016-11-28 LAB — I-STAT BETA HCG BLOOD, ED (MC, WL, AP ONLY)

## 2016-11-28 MED ORDER — CEFTRIAXONE SODIUM 250 MG IJ SOLR
250.0000 mg | Freq: Once | INTRAMUSCULAR | Status: AC
  Administered 2016-11-28: 250 mg via INTRAMUSCULAR
  Filled 2016-11-28: qty 250

## 2016-11-28 MED ORDER — METRONIDAZOLE 500 MG PO TABS: 500.0000 mg | ORAL_TABLET | Freq: Two times a day (BID) | ORAL | 0 refills | Status: DC

## 2016-11-28 MED ORDER — AZITHROMYCIN 250 MG PO TABS
1000.0000 mg | ORAL_TABLET | Freq: Once | ORAL | Status: AC
  Administered 2016-11-28: 1000 mg via ORAL
  Filled 2016-11-28: qty 4

## 2016-11-28 MED ORDER — STERILE WATER FOR INJECTION IJ SOLN
INTRAMUSCULAR | Status: AC
  Filled 2016-11-28: qty 10

## 2016-11-28 MED ORDER — IBUPROFEN 600 MG PO TABS: 600.0000 mg | ORAL_TABLET | Freq: Four times a day (QID) | ORAL | 0 refills | Status: DC | PRN

## 2016-11-28 NOTE — ED Notes (Signed)
Pelvic cart at bedside. 

## 2016-11-28 NOTE — ED Provider Notes (Signed)
[facility]-EM[facility]ENCY DEPT Provider Note   CSN: [id number] Arrival date & time: 11/28/16  1218     History   Chief Complaint Chief Complaint  Patient presents with  . Abdominal Pain  . Breast Pain  . Vaginal Bleeding    HPI Beth Goodwin is a 21 y.o. female.  HPI Patient reports that she's been having heavier than usual vaginal bleeding that started yesterday. She reports that she finished her menstrual cycle a couple of days ago and then started bleeding again. She has a Nexplanon birth control. She's had for 2 years. She reports she's also had lower abdominal discomfort and cramping. Patient is sexually active. Past Medical History:  Diagnosis Date  . Allergy   . GERD (gastroesophageal reflux disease)     Patient Active Problem List   Diagnosis Date Noted  . Common bile duct dilatation   . Recurrent biliary colic   . Biliary colic 11/01/2014  . Abdominal pain 11/01/2014  . Postpartum state 09/13/2014  . [redacted] weeks gestation of pregnancy   . Non-reactive NST (non-stress test)   . NST (non-stress test) nonreactive   . [redacted] weeks gestation of pregnancy   . Insufficient weight gain during pregnancy in third trimester, antepartum   . Abdominal pain in pregnancy 06/09/2014  . Obesity affecting pregnancy in second trimester, antepartum   . Insufficient prenatal care in second trimester 04/25/2014  . Encounter for supervision of normal pregnancy in teen primigravida, antepartum 04/25/2014    Past Surgical History:  Procedure Laterality Date  . CESAREAN SECTION N/A 09/10/2014   Procedure: CESAREAN SECTION;  Surgeon: Catalina Antigua, MD;  Location: [facility];  Service: Obstetrics;  Laterality: N/A;  . CHOLECYSTECTOMY N/A 11/02/2014   Procedure: LAPAROSCOPIC CHOLECYSTECTOMY WITH INTRAOPERATIVE CHOLANGIOGRAM;  Surgeon: Chevis Pretty III, MD;  Location: [facility] OR;  Service: General;  Laterality: N/A;    OB History    Gravida Para Term Preterm AB Living   1 1 1     1    SAB TAB Ectopic  Multiple Live Births         0 1       Home Medications    Prior to Admission medications   Medication Sig Start Date End Date Taking? Authorizing Provider  dicyclomine (BENTYL) 20 MG tablet Take 1 tablet (20 mg total) by mouth 2 (two) times daily. Patient not taking: Reported on 11/28/2016 05/23/16   Marlon Pel, PA-C  etonogestrel (NEXPLANON) 68 MG IMPL implant 1 each by Subdermal route once.    [provider]  HYDROcodone-acetaminophen (NORCO/VICODIN) 5-325 MG tablet Take 1-2 tablets by mouth every 4 (four) hours as needed. Patient not taking: Reported on 11/28/2016 05/23/16   Marlon Pel, PA-C  ibuprofen (ADVIL,MOTRIN) 600 MG tablet Take 1 tablet (600 mg total) by mouth every 6 (six) hours as needed. Patient not taking: Reported on 05/23/2016 02/06/16   Arby Barrette, MD  ibuprofen (ADVIL,MOTRIN) 600 MG tablet Take 1 tablet (600 mg total) by mouth every 6 (six) hours as needed. 11/28/16   Arby Barrette, MD  metroNIDAZOLE (FLAGYL) 500 MG tablet Take 1 tablet (500 mg total) by mouth 2 (two) times daily. One po bid x 7 days 11/28/16   Arby Barrette, MD  ondansetron (ZOFRAN) 4 MG tablet Take 1 tablet (4 mg total) by mouth every 6 (six) hours. Patient not taking: Reported on 11/28/2016 05/23/16   Marlon Pel, PA-C    Family History Family History  Problem Relation Age of Onset  . Hypertension Father   .  Diabetes Maternal Aunt   . Diabetes Maternal Grandmother   . Diabetes Paternal Grandmother     Social History Social History  Substance Use Topics  . Smoking status: Former Games developer  . Smokeless tobacco: Never Used  . Alcohol use No     Allergies   Amoxicillin   Review of Systems Review of Systems 10 Systems reviewed and are negative for acute change except as noted in the HPI.   Physical Exam Updated Vital Signs BP 119/79 (BP Location: Left Arm)   Pulse 86   Temp 98.5 F (36.9 C) (Oral)   Resp 18   SpO2 100%   Physical Exam  Constitutional:  She appears well-developed and well-nourished. No distress.  Patient is alert and nontoxic. Well and appearance.  HENT:  Head: Normocephalic and atraumatic.  Eyes: Conjunctivae are normal.  Neck: Neck supple.  Cardiovascular: Normal rate and regular rhythm.   No murmur heard. Pulmonary/Chest: Effort normal and breath sounds normal. No respiratory distress.  Abdominal: Soft. She exhibits no distension. There is tenderness.  Mild diffuse lower abdominal pain no guarding.  Genitourinary:  Genitourinary Comments: Normal external female genitalia. Moderate amount of yellowish discharge in the vaginal vault. No bleeding. No pooling blood. Mild cervical motion tenderness.  Musculoskeletal: Normal range of motion. She exhibits no edema.  Neurological: She is alert.  Skin: Skin is warm and dry.  Psychiatric: She has a normal mood and affect.  Nursing note and vitals reviewed.    ED Treatments / Results  Labs (all labs ordered are listed, but only abnormal results are displayed) Labs Reviewed  WET PREP, GENITAL - Abnormal; Notable for the following:       Result Value   Trich, Wet Prep PRESENT (*)    Clue Cells Wet Prep HPF POC PRESENT (*)    WBC, Wet Prep HPF POC MANY (*)    All other components within normal limits  URINALYSIS, ROUTINE W REFLEX MICROSCOPIC - Abnormal; Notable for the following:    Hgb urine dipstick LARGE (*)    Leukocytes, UA MODERATE (*)    Squamous Epithelial / LPF 0-5 (*)    All other components within normal limits  LIPASE, BLOOD  COMPREHENSIVE METABOLIC PANEL  CBC  I-STAT BETA HCG BLOOD, ED ([facility], [facility], [facility] ONLY)  GC/CHLAMYDIA PROBE AMP ([place]) NOT AT [facility]    EKG  EKG Interpretation None       Radiology No results found.  Procedures Procedures (including critical care time)  Medications Ordered in ED Medications  cefTRIAXone (ROCEPHIN) injection 250 mg (not administered)  azithromycin (ZITHROMAX) tablet 1,000 mg (not administered)      Initial Impression / Assessment and Plan / ED Course  I have reviewed the triage vital signs and the nursing notes.  Pertinent labs & imaging results that were available during my care of the patient were reviewed by me and considered in my medical decision making (see chart for details).      Final Clinical Impressions(s) / ED Diagnoses   Final diagnoses:  Cervicitis  Trichomonas infection   Patient does test positive for trichomonas. She will be covered for STI. She is clinically well and appearance. Alert and nontoxic. Mild diffuse lower abdominal pain. Afebrile. Patient will be given ibuprofen for pain control. She is counseled to follow-up with women's clinic. New Prescriptions New Prescriptions   IBUPROFEN (ADVIL,MOTRIN) 600 MG TABLET    Take 1 tablet (600 mg total) by mouth every 6 (six) hours as needed.   METRONIDAZOLE (  FLAGYL) 500 MG TABLET    Take 1 tablet (500 mg total) by mouth 2 (two) times daily. One po bid x 7 days     Arby BarrettePfeiffer, Saranya Harlin, MD 11/28/16 1416

## 2016-11-28 NOTE — ED Triage Notes (Signed)
Pt c/o abdominal pain, vaginal bleeding and left breast pain onset today. Pt is using 1 tampon in the last hour.

## 2016-11-30 LAB — GC/CHLAMYDIA PROBE AMP (~~LOC~~) NOT AT ARMC
CHLAMYDIA, DNA PROBE: NEGATIVE
NEISSERIA GONORRHEA: NEGATIVE

## 2016-12-07 ENCOUNTER — Ambulatory Visit: Payer: Medicaid Other | Admitting: Obstetrics & Gynecology

## 2017-09-06 ENCOUNTER — Ambulatory Visit (INDEPENDENT_AMBULATORY_CARE_PROVIDER_SITE_OTHER): Payer: Medicaid Other | Admitting: Obstetrics & Gynecology

## 2017-09-06 ENCOUNTER — Encounter: Payer: Self-pay | Admitting: Obstetrics & Gynecology

## 2017-09-06 VITALS — BP 112/71 | HR 86 | Ht 65.0 in | Wt 261.2 lb

## 2017-09-06 DIAGNOSIS — Z01419 Encounter for gynecological examination (general) (routine) without abnormal findings: Secondary | ICD-10-CM

## 2017-09-06 DIAGNOSIS — Z0000 Encounter for general adult medical examination without abnormal findings: Secondary | ICD-10-CM

## 2017-09-06 DIAGNOSIS — Z3046 Encounter for surveillance of implantable subdermal contraceptive: Secondary | ICD-10-CM

## 2017-09-06 DIAGNOSIS — Z6841 Body Mass Index (BMI) 40.0 and over, adult: Secondary | ICD-10-CM | POA: Insufficient documentation

## 2017-09-06 DIAGNOSIS — Z975 Presence of (intrauterine) contraceptive device: Secondary | ICD-10-CM | POA: Insufficient documentation

## 2017-09-06 NOTE — Patient Instructions (Signed)
Etonogestrel implant What is this medicine? ETONOGESTREL (et oh noe JES trel) is a contraceptive (birth control) device. It is used to prevent pregnancy. It can be used for up to 3 years. This medicine may be used for other purposes; ask your health care provider or pharmacist if you have questions. COMMON BRAND NAME(S): Implanon, Nexplanon What should I tell my health care provider before I take this medicine? They need to know if you have any of these conditions: -abnormal vaginal bleeding -blood vessel disease or blood clots -cancer of the breast, cervix, or liver -depression -diabetes -gallbladder disease -headaches -heart disease or recent heart attack -high blood pressure -high cholesterol -kidney disease -liver disease -renal disease -seizures -tobacco smoker -an unusual or allergic reaction to etonogestrel, other hormones, anesthetics or antiseptics, medicines, foods, dyes, or preservatives -pregnant or trying to get pregnant -breast-feeding How should I use this medicine? This device is inserted just under the skin on the inner side of your upper arm by a health care professional. Talk to your pediatrician regarding the use of this medicine in children. Special care may be needed. Overdosage: If you think you have taken too much of this medicine contact a poison control center or emergency room at once. NOTE: This medicine is only for you. Do not share this medicine with others. What if I miss a dose? This does not apply. What may interact with this medicine? Do not take this medicine with any of the following medications: -amprenavir -bosentan -fosamprenavir This medicine may also interact with the following medications: -barbiturate medicines for inducing sleep or treating seizures -certain medicines for fungal infections like ketoconazole and itraconazole -grapefruit juice -griseofulvin -medicines to treat seizures like carbamazepine, felbamate, oxcarbazepine,  phenytoin, topiramate -modafinil -phenylbutazone -rifampin -rufinamide -some medicines to treat HIV infection like atazanavir, indinavir, lopinavir, nelfinavir, tipranavir, ritonavir -St. John's wort This list may not describe all possible interactions. Give your health care provider a list of all the medicines, herbs, non-prescription drugs, or dietary supplements you use. Also tell them if you smoke, drink alcohol, or use illegal drugs. Some items may interact with your medicine. What should I watch for while using this medicine? This product does not protect you against HIV infection (AIDS) or other sexually transmitted diseases. You should be able to feel the implant by pressing your fingertips over the skin where it was inserted. Contact your doctor if you cannot feel the implant, and use a non-hormonal birth control method (such as condoms) until your doctor confirms that the implant is in place. If you feel that the implant may have broken or become bent while in your arm, contact your healthcare provider. What side effects may I notice from receiving this medicine? Side effects that you should report to your doctor or health care professional as soon as possible: -allergic reactions like skin rash, itching or hives, swelling of the face, lips, or tongue -breast lumps -changes in emotions or moods -depressed mood -heavy or prolonged menstrual bleeding -pain, irritation, swelling, or bruising at the insertion site -scar at site of insertion -signs of infection at the insertion site such as fever, and skin redness, pain or discharge -signs of pregnancy -signs and symptoms of a blood clot such as breathing problems; changes in vision; chest pain; severe, sudden headache; pain, swelling, warmth in the leg; trouble speaking; sudden numbness or weakness of the face, arm or leg -signs and symptoms of liver injury like dark yellow or brown urine; general ill feeling or flu-like symptoms;  light-colored   stools; loss of appetite; nausea; right upper belly pain; unusually weak or tired; yellowing of the eyes or skin -unusual vaginal bleeding, discharge -signs and symptoms of a stroke like changes in vision; confusion; trouble speaking or understanding; severe headaches; sudden numbness or weakness of the face, arm or leg; trouble walking; dizziness; loss of balance or coordination Side effects that usually do not require medical attention (report to your doctor or health care professional if they continue or are bothersome): -acne -back pain -breast pain -changes in weight -dizziness -general ill feeling or flu-like symptoms -headache -irregular menstrual bleeding -nausea -sore throat -vaginal irritation or inflammation This list may not describe all possible side effects. Call your doctor for medical advice about side effects. You may report side effects to FDA at 1-800-FDA-1088. Where should I keep my medicine? This drug is given in a hospital or clinic and will not be stored at home. NOTE: This sheet is a summary. It may not cover all possible information. If you have questions about this medicine, talk to your doctor, pharmacist, or health care provider.  2018 Elsevier/Gold Standard (2015-11-14 11:19:22)  

## 2017-09-06 NOTE — Progress Notes (Addendum)
Patient ID: Beth Goodwin, female   DOB: Oct 06, 1995, 22 y.o.   MRN: [id number]  Chief Complaint  Patient presents with  . Gynecologic Exam  Nexplanon surveillance and well woman, family planning  HPI Beth Goodwin is a 22 y.o. female.  G1P1000 Patient's last menstrual period was 07/12/2017 (approximate). Nexplanon in place since 09/13/14, will return for replacement. She needs physical exam today, no need for pelvic since she is not due to have a pap test. HPI  Past Medical History:  Diagnosis Date  . Allergy   . GERD (gastroesophageal reflux disease)     Past Surgical History:  Procedure Laterality Date  . CESAREAN SECTION N/A 09/10/2014   Procedure: CESAREAN SECTION;  Surgeon: Catalina Antigua, MD;  Location: [facility];  Service: Obstetrics;  Laterality: N/A;  . CHOLECYSTECTOMY N/A 11/02/2014   Procedure: LAPAROSCOPIC CHOLECYSTECTOMY WITH INTRAOPERATIVE CHOLANGIOGRAM;  Surgeon: Chevis Pretty III, MD;  Location: [facility] OR;  Service: General;  Laterality: N/A;    Family History  Problem Relation Age of Onset  . Hypertension Father   . Hypertension Mother   . Diabetes Maternal Aunt   . Diabetes Maternal Grandmother   . Diabetes Paternal Grandmother     Social History Social History   Tobacco Use  . Smoking status: Current Some Day Smoker  . Smokeless tobacco: Never Used  Substance Use Topics  . Alcohol use: No  . Drug use: Yes    Types: Marijuana    Allergies  Allergen Reactions  . Amoxicillin Hives    Has patient had a PCN reaction causing immediate rash, facial/tongue/throat swelling, SOB or lightheadedness with hypotension: Yes Has patient had a PCN reaction causing severe rash involving mucus membranes or skin necrosis: No Has patient had a PCN reaction that required hospitalization: No Has patient had a PCN reaction occurring within the last 10 years: No If all of the above answers are "NO", then may proceed with Cephalosporin use.     Current Outpatient  Medications  Medication Sig Dispense Refill  . dicyclomine (BENTYL) 20 MG tablet Take 1 tablet (20 mg total) by mouth 2 (two) times daily. (Patient not taking: Reported on 11/28/2016) 10 tablet 0  . etonogestrel (NEXPLANON) 68 MG IMPL implant 1 each by Subdermal route once.    Marland Kitchen HYDROcodone-acetaminophen (NORCO/VICODIN) 5-325 MG tablet Take 1-2 tablets by mouth every 4 (four) hours as needed. (Patient not taking: Reported on 11/28/2016) 20 tablet 0  . ibuprofen (ADVIL,MOTRIN) 600 MG tablet Take 1 tablet (600 mg total) by mouth every 6 (six) hours as needed. (Patient not taking: Reported on 05/23/2016) 30 tablet 0  . ibuprofen (ADVIL,MOTRIN) 600 MG tablet Take 1 tablet (600 mg total) by mouth every 6 (six) hours as needed. 30 tablet 0  . metroNIDAZOLE (FLAGYL) 500 MG tablet Take 1 tablet (500 mg total) by mouth 2 (two) times daily. One po bid x 7 days 14 tablet 0  . ondansetron (ZOFRAN) 4 MG tablet Take 1 tablet (4 mg total) by mouth every 6 (six) hours. (Patient not taking: Reported on 11/28/2016) 12 tablet 0   No current facility-administered medications for this visit.     Review of Systems Review of Systems  Constitutional: Negative.   Respiratory: Negative.   Gastrointestinal: Negative.   Endocrine: Negative.   Genitourinary: Negative.     Blood pressure 112/71, pulse 86, height  (1.651 m), weight 261 lb 3.2 oz (118.5 kg), last menstrual period 07/12/2017.  Physical Exam Physical Exam  Constitutional: She appears well-developed.  No distress.  HENT:  Head: Normocephalic.  Eyes: Pupils are equal, round, and reactive to light.  Neck: Normal range of motion.  Cardiovascular: Normal rate and normal heart sounds.  Pulmonary/Chest: Effort normal and breath sounds normal.  Abdominal: Soft. She exhibits no distension. There is no tenderness.  obese  Genitourinary:  Genitourinary Comments: No pelvic exam  Vitals reviewed. Breasts: breasts appear normal, no suspicious masses, no skin  or nipple changes or axillary nodes. Nipple piercings  Data Reviewed Pregnancy notes  Assessment    Patient Active Problem List   Diagnosis Date Noted  . Nexplanon in place 09/06/2017  . Morbid obesity (HCC) 09/06/2017       Plan    Well woman physical done Return to replace Nexplanon next month       Scheryl Darter 09/06/2017, 11:53 AM

## 2017-10-11 ENCOUNTER — Encounter (HOSPITAL_COMMUNITY): Payer: Self-pay | Admitting: Emergency Medicine

## 2017-10-11 ENCOUNTER — Emergency Department (HOSPITAL_COMMUNITY): Payer: Medicaid Other

## 2017-10-11 ENCOUNTER — Emergency Department (HOSPITAL_COMMUNITY)
Admission: EM | Admit: 2017-10-11 | Discharge: 2017-10-11 | Disposition: A | Discharge status: Home / Self Care | Payer: Medicaid Other | Attending: Emergency Medicine | Admitting: Emergency Medicine

## 2017-10-11 DIAGNOSIS — F17210 Nicotine dependence, cigarettes, uncomplicated: Secondary | ICD-10-CM | POA: Insufficient documentation

## 2017-10-11 DIAGNOSIS — N390 Urinary tract infection, site not specified: Secondary | ICD-10-CM

## 2017-10-11 DIAGNOSIS — R1030 Lower abdominal pain, unspecified: Secondary | ICD-10-CM

## 2017-10-11 LAB — URINALYSIS, ROUTINE W REFLEX MICROSCOPIC
Bacteria, UA: NONE SEEN
Bilirubin Urine: NEGATIVE
Glucose, UA: NEGATIVE mg/dL
Ketones, ur: NEGATIVE mg/dL
Nitrite: NEGATIVE
Protein, ur: 30 mg/dL — AB
Specific Gravity, Urine: 1.023 (ref 1.005–1.030)
pH: 5 (ref 5.0–8.0)

## 2017-10-11 LAB — COMPREHENSIVE METABOLIC PANEL
ALT: 34 U/L (ref 14–54)
AST: 27 U/L (ref 15–41)
Albumin: 3.8 g/dL (ref 3.5–5.0)
Alkaline Phosphatase: 78 U/L (ref 38–126)
Anion gap: 10 (ref 5–15)
BUN: 7 mg/dL (ref 6–20)
CO2: 23 mmol/L (ref 22–32)
Calcium: 8.9 mg/dL (ref 8.9–10.3)
Chloride: 104 mmol/L (ref 101–111)
Creatinine, Ser: 0.73 mg/dL (ref 0.44–1.00)
GFR calc Af Amer: >60 mL/min (ref >60)
GFR calc non Af Amer: >60 mL/min (ref >60)
Glucose, Bld: 102 mg/dL — ABNORMAL HIGH (ref 65–99)
Potassium: 3.9 mmol/L (ref 3.5–5.1)
Sodium: 137 mmol/L (ref 135–145)
Total Bilirubin: 1.2 mg/dL (ref 0.3–1.2)
Total Protein: 7.7 g/dL (ref 6.5–8.1)

## 2017-10-11 LAB — CBC
HCT: 44.3 % (ref 36.0–46.0)
Hemoglobin: 14.3 g/dL (ref 12.0–15.0)
MCH: 28.8 pg (ref 26.0–34.0)
MCHC: 32.3 g/dL (ref 30.0–36.0)
MCV: 89.1 fL (ref 78.0–100.0)
Platelets: 334 10*3/uL (ref 150–400)
RBC: 4.97 MIL/uL (ref 3.87–5.11)
RDW: 13.4 % (ref 11.5–15.5)
WBC: 14.4 10*3/uL — ABNORMAL HIGH (ref 4.0–10.5)

## 2017-10-11 LAB — LIPASE, BLOOD: Lipase: 21 U/L (ref 11–51)

## 2017-10-11 LAB — I-STAT BETA HCG BLOOD, ED (MC, WL, AP ONLY): I-stat hCG, quantitative: <5 m[IU]/mL (ref <5)

## 2017-10-11 MED ORDER — IOPAMIDOL (ISOVUE-300) INJECTION 61%
100.0000 mL | Freq: Once | INTRAVENOUS | Status: AC | PRN
  Administered 2017-10-11: 100 mL via INTRAVENOUS

## 2017-10-11 MED ORDER — LACTATED RINGERS IV BOLUS
1000.0000 mL | Freq: Once | INTRAVENOUS | Status: AC
  Administered 2017-10-11: 1000 mL via INTRAVENOUS

## 2017-10-11 MED ORDER — SULFAMETHOXAZOLE-TRIMETHOPRIM 800-160 MG PO TABS
1.0000 | ORAL_TABLET | Freq: Once | ORAL | Status: AC
  Administered 2017-10-11: 1 via ORAL
  Filled 2017-10-11: qty 1

## 2017-10-11 MED ORDER — IOPAMIDOL (ISOVUE-300) INJECTION 61%
INTRAVENOUS | Status: AC
  Filled 2017-10-11: qty 100

## 2017-10-11 MED ORDER — HYDROMORPHONE HCL 1 MG/ML IJ SOLN
1.0000 mg | Freq: Once | INTRAMUSCULAR | Status: AC
  Administered 2017-10-11: 1 mg via INTRAVENOUS
  Filled 2017-10-11: qty 1

## 2017-10-11 MED ORDER — ONDANSETRON HCL 4 MG/2ML IJ SOLN
4.0000 mg | Freq: Once | INTRAMUSCULAR | Status: AC
  Administered 2017-10-11: 4 mg via INTRAVENOUS
  Filled 2017-10-11: qty 2

## 2017-10-11 MED ORDER — KETOROLAC TROMETHAMINE 15 MG/ML IJ SOLN
15.0000 mg | Freq: Once | INTRAMUSCULAR | Status: AC
  Administered 2017-10-11: 15 mg via INTRAVENOUS
  Filled 2017-10-11: qty 1

## 2017-10-11 MED ORDER — SULFAMETHOXAZOLE-TRIMETHOPRIM 800-160 MG PO TABS: 1.0000 | ORAL_TABLET | Freq: Two times a day (BID) | ORAL | 0 refills | Status: AC

## 2017-10-11 NOTE — ED Provider Notes (Signed)
[facility]-EMERGENCY DEPT Provider Note   CSN: [id number] Arrival date & time: 10/11/17  1437     History   Chief Complaint Chief Complaint  Patient presents with  . Abdominal Pain    HPI Beth Goodwin is a 22 y.o. female.  HPI   22 year old female with abdominal pain.  Pain is in the lower abdomen, somewhat worse on the left side.  Onset this morning.  Persistent throughout the day.  Associated with nausea and vomited once.  Also hurts to walk with a small amount of bright red blood mixed in with it.  Increased urinary frequency.  No dysuria.  No fevers or chills.  Past Medical History:  Diagnosis Date  . Allergy   . GERD (gastroesophageal reflux disease)     Patient Active Problem List   Diagnosis Date Noted  . Nexplanon in place 09/06/2017  . Morbid obesity (HCC) 09/06/2017    Past Surgical History:  Procedure Laterality Date  . CESAREAN SECTION N/A 09/10/2014   Procedure: CESAREAN SECTION;  Surgeon: Catalina Antigua, MD;  Location: [facility];  Service: Obstetrics;  Laterality: N/A;  . CHOLECYSTECTOMY N/A 11/02/2014   Procedure: LAPAROSCOPIC CHOLECYSTECTOMY WITH INTRAOPERATIVE CHOLANGIOGRAM;  Surgeon: Chevis Pretty III, MD;  Location: [facility] OR;  Service: General;  Laterality: N/A;     OB History    Gravida  1   Para  1   Term  1   Preterm      AB      Living  0     SAB      TAB      Ectopic      Multiple  0   Live Births  1            Home Medications    Prior to Admission medications   Medication Sig Start Date End Date Taking? Authorizing Provider  etonogestrel (NEXPLANON) 68 MG IMPL implant 1 each by Subdermal route once.   Yes [provider]  dicyclomine (BENTYL) 20 MG tablet Take 1 tablet (20 mg total) by mouth 2 (two) times daily. Patient not taking: Reported on 11/28/2016 05/23/16   Marlon Pel, PA-C  HYDROcodone-acetaminophen (NORCO/VICODIN) 5-325 MG tablet Take 1-2 tablets by mouth every 4 (four) hours  as needed. Patient not taking: Reported on 11/28/2016 05/23/16   Marlon Pel, PA-C  ibuprofen (ADVIL,MOTRIN) 600 MG tablet Take 1 tablet (600 mg total) by mouth every 6 (six) hours as needed. Patient not taking: Reported on 05/23/2016 02/06/16   Arby Barrette, MD  ibuprofen (ADVIL,MOTRIN) 600 MG tablet Take 1 tablet (600 mg total) by mouth every 6 (six) hours as needed. Patient not taking: Reported on 10/11/2017 11/28/16   Arby Barrette, MD  metroNIDAZOLE (FLAGYL) 500 MG tablet Take 1 tablet (500 mg total) by mouth 2 (two) times daily. One po bid x 7 days Patient not taking: Reported on 10/11/2017 11/28/16   Arby Barrette, MD  ondansetron (ZOFRAN) 4 MG tablet Take 1 tablet (4 mg total) by mouth every 6 (six) hours. Patient not taking: Reported on 11/28/2016 05/23/16   Marlon Pel, PA-C  sulfamethoxazole-trimethoprim (BACTRIM DS,SEPTRA DS) 800-160 MG tablet Take 1 tablet by mouth 2 (two) times daily for 5 days. 10/11/17 10/16/17  Raeford Razor, MD    Family History Family History  Problem Relation Age of Onset  . Hypertension Father   . Hypertension Mother   . Diabetes Maternal Aunt   . Diabetes Maternal Grandmother   . Diabetes Paternal Grandmother  Social History Social History   Tobacco Use  . Smoking status: Current Some Day Smoker  . Smokeless tobacco: Never Used  Substance Use Topics  . Alcohol use: No  . Drug use: Yes    Types: Marijuana     Allergies   Amoxicillin   Review of Systems Review of Systems All systems reviewed and negative, other than as noted in HPI.   Physical Exam Updated Vital Signs BP 125/67 (BP Location: Right Arm)   Pulse 100   Temp 99.4 F (37.4 C) (Oral)   Resp 15   Ht 5\' 4"  (1.626 m)   Wt 120.7 kg (266 lb)   SpO2 99%   BMI 45.66 kg/m   Physical Exam  Constitutional: She appears well-developed and well-nourished. No distress.  HENT:  Head: Normocephalic and atraumatic.  Eyes: Conjunctivae are normal. Right eye exhibits no  discharge. Left eye exhibits no discharge.  Neck: Neck supple.  Cardiovascular: Normal rate, regular rhythm and normal heart sounds. Exam reveals no gallop and no friction rub.  No murmur heard. Pulmonary/Chest: Effort normal and breath sounds normal. No respiratory distress.  Abdominal: Soft. She exhibits no distension. There is no tenderness.  Mild lower abdominal tenderness without rebound or guarding.  No distention.  No CVA tenderness.  Musculoskeletal: She exhibits no edema or tenderness.  Neurological: She is alert.  Skin: Skin is warm and dry.  Psychiatric: She has a normal mood and affect. Her behavior is normal. Thought content normal.  Nursing note and vitals reviewed.    ED Treatments / Results  Labs (all labs ordered are listed, but only abnormal results are displayed) Labs Reviewed  COMPREHENSIVE METABOLIC PANEL - Abnormal; Notable for the following components:      Result Value   Glucose, Bld 102 (*)    All other components within normal limits  CBC - Abnormal; Notable for the following components:   WBC 14.4 (*)    All other components within normal limits  URINALYSIS, ROUTINE W REFLEX MICROSCOPIC - Abnormal; Notable for the following components:   APPearance TURBID (*)    Hgb urine dipstick SMALL (*)    Protein, ur 30 (*)    Leukocytes, UA SMALL (*)    All other components within normal limits  URINE CULTURE  LIPASE, BLOOD  I-STAT BETA HCG BLOOD, ED ([facility], [facility], [facility] ONLY)    EKG None  Radiology Ct Abdomen Pelvis W Contrast  Result Date: 10/11/2017 CLINICAL DATA:  Abdominal pain starting this morning.  Generalized. EXAM: CT ABDOMEN AND PELVIS WITH CONTRAST TECHNIQUE: Multidetector CT imaging of the abdomen and pelvis was performed using the standard protocol following bolus administration of intravenous contrast. CONTRAST:  ISOVUE-300 IOPAMIDOL (ISOVUE-300) INJECTION 61% COMPARISON:  02/06/2016 FINDINGS: Lower chest: Clear lung bases. Normal heart size  without pericardial or pleural effusion. Hepatobiliary: Mild hepatomegaly at 18.3 cm craniocaudal. Variant lateral segment left liver lobe extending in the left upper quadrant. No focal liver lesion. Cholecystectomy, without biliary ductal dilatation. Pancreas: Normal, without mass or ductal dilatation. Spleen: Normal in size, without focal abnormality. Adrenals/Urinary Tract: Normal adrenal glands. Normal kidneys, without hydronephrosis. Normal urinary bladder. Stomach/Bowel: Normal stomach, without wall thickening. Normal colon, appendix, and terminal ileum. Normal small bowel. Vascular/Lymphatic: Normal caliber of the aorta and branch vessels. No abdominopelvic adenopathy. Reproductive: Normal uterus and adnexa. Other: No significant free fluid. Musculoskeletal: No acute osseous abnormality. IMPRESSION: 1.  No acute process or explanation for abdominal pain. 2. Mild hepatomegaly. Electronically Signed   By: Ronaldo Miyamoto  Reche Dixonalbot M.D.   On: 10/11/2017 18:37    Procedures Procedures (including critical care time)  Medications Ordered in ED Medications  iopamidol (ISOVUE-300) 61 % injection (has no administration in time range)  lactated ringers bolus 1,000 mL (1,000 mLs Intravenous New Bag/Given 10/11/17 1748)  HYDROmorphone (DILAUDID) injection 1 mg (1 mg Intravenous Given 10/11/17 1750)  ketorolac (TORADOL) 15 MG/ML injection 15 mg (15 mg Intravenous Given 10/11/17 1750)  ondansetron (ZOFRAN) injection 4 mg (4 mg Intravenous Given 10/11/17 1749)  sulfamethoxazole-trimethoprim (BACTRIM DS,SEPTRA DS) 800-160 MG per tablet 1 tablet (1 tablet Oral Given 10/11/17 1751)  iopamidol (ISOVUE-300) 61 % injection 100 mL (100 mLs Intravenous Contrast Given 10/11/17 1805)     Initial Impression / Assessment and Plan / ED Course  I have reviewed the triage vital signs and the nursing notes.  Pertinent labs & imaging results that were available during my care of the patient were reviewed by me and considered in my medical  decision making (see chart for details).     22 year old female with lower abdominal discomfort increased urinary frequency.  May have a UTI. BActrim and will send urine culture. I feel she is appropriate for outpatient treatment. Other labs and CT report reviewed. CT w/o acute pathology.   Final Clinical Impressions(s) / ED Diagnoses   Final diagnoses:  Lower abdominal pain  Urinary tract infection without hematuria, site unspecified    ED Discharge Orders        Ordered    sulfamethoxazole-trimethoprim (BACTRIM DS,SEPTRA DS) 800-160 MG tablet  2 times daily     10/11/17 1918       Raeford RazorKohut, Amylynn Fano, MD 10/11/17 Ernestina [id number]

## 2017-10-11 NOTE — ED Triage Notes (Signed)
Per [facility] pt from home for abd pains that radiates to back and is all over that starting this morning. Reports had hard stool and not loose, coffee ground stools and nausea.

## 2017-10-12 LAB — URINE CULTURE

## 2017-10-21 ENCOUNTER — Ambulatory Visit: Payer: Medicaid Other | Admitting: Obstetrics & Gynecology

## 2017-12-03 ENCOUNTER — Ambulatory Visit (INDEPENDENT_AMBULATORY_CARE_PROVIDER_SITE_OTHER): Payer: Medicaid Other | Admitting: Obstetrics & Gynecology

## 2017-12-03 ENCOUNTER — Encounter: Payer: Self-pay | Admitting: Obstetrics & Gynecology

## 2017-12-03 VITALS — BP 125/71 | HR 89 | Ht 64.0 in | Wt 264.0 lb

## 2017-12-03 DIAGNOSIS — Z975 Presence of (intrauterine) contraceptive device: Secondary | ICD-10-CM

## 2017-12-03 DIAGNOSIS — Z3046 Encounter for surveillance of implantable subdermal contraceptive: Secondary | ICD-10-CM

## 2017-12-03 DIAGNOSIS — Z30017 Encounter for initial prescription of implantable subdermal contraceptive: Secondary | ICD-10-CM | POA: Diagnosis present

## 2017-12-03 MED ORDER — ETONOGESTREL 68 MG ~~LOC~~ IMPL
68.0000 mg | DRUG_IMPLANT | Freq: Once | SUBCUTANEOUS | Status: AC
  Administered 2017-12-03: 68 mg via SUBCUTANEOUS

## 2017-12-03 NOTE — Progress Notes (Signed)
GYNECOLOGY OFFICE PROCEDURE NOTE  Beth Goodwin is a 22 y.o. G1P1000 here for Nexplanon removal and insertion. No other gynecologic concerns.   Nexplanon Removal and Insertion  Patient identified, informed consent performed, consent signed.   Patient does understand that irregular bleeding is a very common side effect of this medication. She was advised to have backup contraception for one week after replacement of the implant. Pregnancy test in clinic today was negative.  Appropriate time out taken. Implanon site identified. Area prepped in usual sterile fashon. One ml of 1% lidocaine was used to anesthetize the area at the distal end of the implant. A small stab incision was made right beside the implant on the distal portion. The Nexplanon rod was grasped using hemostats and removed without difficulty. There was minimal blood loss. There were no complications. Area was then injected with 3 ml of 1 % lidocaine. She was re-prepped with betadine, Nexplanon removed from packaging, Device confirmed in needle, then inserted full length of needle and withdrawn per handbook instructions. Nexplanon was able to palpated in the patient's arm; patient palpated the insert herself.  There was minimal blood loss. Patient insertion site covered with gauze and a pressure bandage to reduce any bruising. The patient tolerated the procedure well and was given post procedure instructions.  She was advised to have backup contraception for one week.  She plans to abstain for 2 weeks.  Adam PhenixArnold, James G, MD Attending Obstetrician & Gynecologist, [facility]  12/03/2017

## 2017-12-03 NOTE — Patient Instructions (Signed)
Etonogestrel implant What is this medicine? ETONOGESTREL (et oh noe JES trel) is a contraceptive (birth control) device. It is used to prevent pregnancy. It can be used for up to 3 years. This medicine may be used for other purposes; ask your health care provider or pharmacist if you have questions. COMMON BRAND NAME(S): Implanon, Nexplanon What should I tell my health care provider before I take this medicine? They need to know if you have any of these conditions: -abnormal vaginal bleeding -blood vessel disease or blood clots -cancer of the breast, cervix, or liver -depression -diabetes -gallbladder disease -headaches -heart disease or recent heart attack -high blood pressure -high cholesterol -kidney disease -liver disease -renal disease -seizures -tobacco smoker -an unusual or allergic reaction to etonogestrel, other hormones, anesthetics or antiseptics, medicines, foods, dyes, or preservatives -pregnant or trying to get pregnant -breast-feeding How should I use this medicine? This device is inserted just under the skin on the inner side of your upper arm by a health care professional. Talk to your pediatrician regarding the use of this medicine in children. Special care may be needed. Overdosage: If you think you have taken too much of this medicine contact a poison control center or emergency room at once. NOTE: This medicine is only for you. Do not share this medicine with others. What if I miss a dose? This does not apply. What may interact with this medicine? Do not take this medicine with any of the following medications: -amprenavir -bosentan -fosamprenavir This medicine may also interact with the following medications: -barbiturate medicines for inducing sleep or treating seizures -certain medicines for fungal infections like ketoconazole and itraconazole -grapefruit juice -griseofulvin -medicines to treat seizures like carbamazepine, felbamate, oxcarbazepine,  phenytoin, topiramate -modafinil -phenylbutazone -rifampin -rufinamide -some medicines to treat HIV infection like atazanavir, indinavir, lopinavir, nelfinavir, tipranavir, ritonavir -St. John's wort This list may not describe all possible interactions. Give your health care provider a list of all the medicines, herbs, non-prescription drugs, or dietary supplements you use. Also tell them if you smoke, drink alcohol, or use illegal drugs. Some items may interact with your medicine. What should I watch for while using this medicine? This product does not protect you against HIV infection (AIDS) or other sexually transmitted diseases. You should be able to feel the implant by pressing your fingertips over the skin where it was inserted. Contact your doctor if you cannot feel the implant, and use a non-hormonal birth control method (such as condoms) until your doctor confirms that the implant is in place. If you feel that the implant may have broken or become bent while in your arm, contact your healthcare provider. What side effects may I notice from receiving this medicine? Side effects that you should report to your doctor or health care professional as soon as possible: -allergic reactions like skin rash, itching or hives, swelling of the face, lips, or tongue -breast lumps -changes in emotions or moods -depressed mood -heavy or prolonged menstrual bleeding -pain, irritation, swelling, or bruising at the insertion site -scar at site of insertion -signs of infection at the insertion site such as fever, and skin redness, pain or discharge -signs of pregnancy -signs and symptoms of a blood clot such as breathing problems; changes in vision; chest pain; severe, sudden headache; pain, swelling, warmth in the leg; trouble speaking; sudden numbness or weakness of the face, arm or leg -signs and symptoms of liver injury like dark yellow or brown urine; general ill feeling or flu-like symptoms;  light-colored   stools; loss of appetite; nausea; right upper belly pain; unusually weak or tired; yellowing of the eyes or skin -unusual vaginal bleeding, discharge -signs and symptoms of a stroke like changes in vision; confusion; trouble speaking or understanding; severe headaches; sudden numbness or weakness of the face, arm or leg; trouble walking; dizziness; loss of balance or coordination Side effects that usually do not require medical attention (report to your doctor or health care professional if they continue or are bothersome): -acne -back pain -breast pain -changes in weight -dizziness -general ill feeling or flu-like symptoms -headache -irregular menstrual bleeding -nausea -sore throat -vaginal irritation or inflammation This list may not describe all possible side effects. Call your doctor for medical advice about side effects. You may report side effects to FDA at 1-800-FDA-1088. Where should I keep my medicine? This drug is given in a hospital or clinic and will not be stored at home. NOTE: This sheet is a summary. It may not cover all possible information. If you have questions about this medicine, talk to your doctor, pharmacist, or health care provider.  2018 Elsevier/Gold Standard (2015-11-14 11:19:22) Nexplanon Instructions After Insertion   Keep bandage clean and dry for 24 hours   May use ice/Tylenol/Ibuprofen for soreness or pain   If you develop fever, drainage or increased warmth from incision site-contact office immediately   

## 2017-12-03 NOTE — Addendum Note (Signed)
Addended by: Ernestina PatchesAPEL, Jeren Dufrane S on: 12/03/2017 11:03 AM   Modules accepted: Orders

## 2018-05-01 ENCOUNTER — Inpatient Hospital Stay (HOSPITAL_COMMUNITY)
Admission: AD | Admit: 2018-05-01 | Discharge: 2018-05-01 | Disposition: A | Discharge status: Home / Self Care | Payer: Medicaid Other | Source: Ambulatory Visit | Attending: Obstetrics & Gynecology | Admitting: Obstetrics & Gynecology

## 2018-05-01 ENCOUNTER — Other Ambulatory Visit: Payer: Self-pay

## 2018-05-01 ENCOUNTER — Encounter (HOSPITAL_COMMUNITY): Payer: Self-pay | Admitting: *Deleted

## 2018-05-01 DIAGNOSIS — R109 Unspecified abdominal pain: Secondary | ICD-10-CM | POA: Diagnosis present

## 2018-05-01 DIAGNOSIS — K219 Gastro-esophageal reflux disease without esophagitis: Secondary | ICD-10-CM | POA: Diagnosis not present

## 2018-05-01 DIAGNOSIS — Z791 Long term (current) use of non-steroidal anti-inflammatories (NSAID): Secondary | ICD-10-CM | POA: Diagnosis not present

## 2018-05-01 DIAGNOSIS — Z711 Person with feared health complaint in whom no diagnosis is made: Secondary | ICD-10-CM | POA: Diagnosis not present

## 2018-05-01 DIAGNOSIS — Z8249 Family history of ischemic heart disease and other diseases of the circulatory system: Secondary | ICD-10-CM | POA: Diagnosis not present

## 2018-05-01 DIAGNOSIS — Z79899 Other long term (current) drug therapy: Secondary | ICD-10-CM | POA: Insufficient documentation

## 2018-05-01 DIAGNOSIS — F17200 Nicotine dependence, unspecified, uncomplicated: Secondary | ICD-10-CM | POA: Diagnosis not present

## 2018-05-01 LAB — CBC WITH DIFFERENTIAL/PLATELET
BASOS ABS: 0 10*3/uL (ref 0.0–0.1)
Basophils Relative: 0 %
EOS PCT: 1 %
Eosinophils Absolute: 0.1 10*3/uL (ref 0.0–0.5)
HEMATOCRIT: 41.8 % (ref 36.0–46.0)
HEMOGLOBIN: 13.2 g/dL (ref 12.0–15.0)
LYMPHS ABS: 4.2 10*3/uL — AB (ref 0.7–4.0)
LYMPHS PCT: 29 %
MCH: 28.4 pg (ref 26.0–34.0)
MCHC: 31.6 g/dL (ref 30.0–36.0)
MCV: 90.1 fL (ref 80.0–100.0)
Monocytes Absolute: 0.5 10*3/uL (ref 0.1–1.0)
Monocytes Relative: 3 %
NEUTROS ABS: 9.9 10*3/uL — AB (ref 1.7–7.7)
NEUTROS PCT: 67 %
Platelets: 360 10*3/uL (ref 150–400)
RBC: 4.64 MIL/uL (ref 3.87–5.11)
RDW: 13.4 % (ref 11.5–15.5)
WBC: 14.7 10*3/uL — ABNORMAL HIGH (ref 4.0–10.5)
nRBC: 0 % (ref 0.0–0.2)

## 2018-05-01 LAB — URINALYSIS, ROUTINE W REFLEX MICROSCOPIC
Bilirubin Urine: NEGATIVE
GLUCOSE, UA: NEGATIVE mg/dL
Hgb urine dipstick: NEGATIVE
KETONES UR: 5 mg/dL — AB
Leukocytes, UA: NEGATIVE
Nitrite: NEGATIVE
PH: 6 (ref 5.0–8.0)
Protein, ur: NEGATIVE mg/dL
SPECIFIC GRAVITY, URINE: 1.024 (ref 1.005–1.030)

## 2018-05-01 LAB — BASIC METABOLIC PANEL
ANION GAP: 6 (ref 5–15)
BUN: 6 mg/dL (ref 6–20)
CHLORIDE: 106 mmol/L (ref 98–111)
CO2: 25 mmol/L (ref 22–32)
Calcium: 8.3 mg/dL — ABNORMAL LOW (ref 8.9–10.3)
Creatinine, Ser: 0.56 mg/dL (ref 0.44–1.00)
GFR calc non Af Amer: >60 mL/min (ref >60)
Glucose, Bld: 95 mg/dL (ref 70–99)
POTASSIUM: 3.7 mmol/L (ref 3.5–5.1)
Sodium: 137 mmol/L (ref 135–145)

## 2018-05-01 LAB — POCT PREGNANCY, URINE: Preg Test, Ur: NEGATIVE

## 2018-05-01 LAB — HCG, SERUM, QUALITATIVE: Preg, Serum: NEGATIVE

## 2018-05-01 NOTE — MAU Note (Signed)
Pt left without dc instructions being given.

## 2018-05-01 NOTE — MAU Provider Note (Signed)
History     CSN: [id number]  Arrival date and time: 05/01/18 1941   First Provider Initiated Contact with Patient 05/01/18 2156      Chief Complaint  Patient presents with  . Possible Pregnancy   HPI  Ms.  Beth Goodwin is a 22 y.o. year old G58P1010 non-pregnant female who presents to MAU reporting that she had 2 positive HPTs on 12/19 and 2 negative HPTs on 12/21. She has a Nexplanon in place. She has not had a period since the last week of May. She denies VB. She complains of upper abdominal pain; "feels like knots in her stomach." the pain makes it hard for her to eat or sleep. She states that she has lost 2 babies in the past, afraid she is pregnant and the [facility] may make her lose this one." She states, "I know my body and there is something moving around in there. I want an ultrasound or a x-ray to find out what it is."   Past Medical History:  Diagnosis Date  . Allergy   . GERD (gastroesophageal reflux disease)     Past Surgical History:  Procedure Laterality Date  . CESAREAN SECTION N/A 09/10/2014   Procedure: CESAREAN SECTION;  Surgeon: Catalina Antigua, MD;  Location: [facility];  Service: Obstetrics;  Laterality: N/A;  . CHOLECYSTECTOMY N/A 11/02/2014   Procedure: LAPAROSCOPIC CHOLECYSTECTOMY WITH INTRAOPERATIVE CHOLANGIOGRAM;  Surgeon: Chevis Pretty III, MD;  Location: [facility] OR;  Service: General;  Laterality: N/A;    Family History  Problem Relation Age of Onset  . Hypertension Father   . Diabetes Father   . Hypertension Mother   . Diabetes Maternal Aunt   . Diabetes Maternal Grandmother   . Diabetes Paternal Grandmother     Social History   Tobacco Use  . Smoking status: Current Some Day Smoker  . Smokeless tobacco: Never Used  Substance Use Topics  . Alcohol use: No  . Drug use: Yes    Types: Marijuana    Allergies:  Allergies  Allergen Reactions  . Strawberry Flavor Itching  . Amoxicillin Hives    Has patient had a PCN reaction causing immediate rash,  facial/tongue/throat swelling, SOB or lightheadedness with hypotension: Yes Has patient had a PCN reaction causing severe rash involving mucus membranes or skin necrosis: No Has patient had a PCN reaction that required hospitalization: No Has patient had a PCN reaction occurring within the last 10 years: No If all of the above answers are "NO", then may proceed with Cephalosporin use.     Medications Prior to Admission  Medication Sig Dispense Refill Last Dose  . dicyclomine (BENTYL) 20 MG tablet Take 1 tablet (20 mg total) by mouth 2 (two) times daily. (Patient not taking: Reported on 11/28/2016) 10 tablet 0 Not Taking  . etonogestrel (NEXPLANON) 68 MG IMPL implant 1 each by Subdermal route once.   Taking  . HYDROcodone-acetaminophen (NORCO/VICODIN) 5-325 MG tablet Take 1-2 tablets by mouth every 4 (four) hours as needed. (Patient not taking: Reported on 11/28/2016) 20 tablet 0 Not Taking  . ibuprofen (ADVIL,MOTRIN) 600 MG tablet Take 1 tablet (600 mg total) by mouth every 6 (six) hours as needed. (Patient not taking: Reported on 05/23/2016) 30 tablet 0 Not Taking  . ibuprofen (ADVIL,MOTRIN) 600 MG tablet Take 1 tablet (600 mg total) by mouth every 6 (six) hours as needed. (Patient not taking: Reported on 10/11/2017) 30 tablet 0 Not Taking  . metroNIDAZOLE (FLAGYL) 500 MG tablet Take 1 tablet (500 mg  total) by mouth 2 (two) times daily. One po bid x 7 days (Patient not taking: Reported on 10/11/2017) 14 tablet 0 Not Taking  . ondansetron (ZOFRAN) 4 MG tablet Take 1 tablet (4 mg total) by mouth every 6 (six) hours. (Patient not taking: Reported on 11/28/2016) 12 tablet 0 Not Taking    Review of Systems  Constitutional: Positive for appetite change ("knots in abd makes it hard to eat").  HENT: Negative.   Eyes: Negative.   Respiratory: Negative.   Cardiovascular: Negative.   Gastrointestinal: Positive for abdominal pain (upper abd pain, "feels like knots & a baby moving and kicking in there; for a  while. Makes it hard to get comfortable to sleep.").  Endocrine: Negative.   Genitourinary: Positive for menstrual problem (no period since the end of May).  Musculoskeletal: Negative.   Skin: Negative.   Allergic/Immunologic: Negative.   Neurological: Negative.   Hematological: Negative.   Psychiatric/Behavioral: Negative.    Physical Exam   Blood pressure (!) 142/76, pulse 97, temperature 98.3 F (36.8 C), temperature source Oral, resp. rate 19, height 5\' 4"  (1.626 m), weight 118.4 kg.  Physical Exam  Nursing note and vitals reviewed. Constitutional: She is oriented to person, place, and time. She appears well-developed and well-nourished.  HENT:  Head: Normocephalic and atraumatic.  Eyes: Pupils are equal, round, and reactive to light.  Neck: Normal range of motion.  Cardiovascular: Normal rate, regular rhythm and normal heart sounds.  Respiratory: Effort normal and breath sounds normal.  GI: Soft. Bowel sounds are normal. There is abdominal tenderness (in upper quadrants). There is no rebound and no guarding.  Genitourinary:    Genitourinary Comments: Pelvic deferred   Musculoskeletal: Normal range of motion.  Neurological: She is alert and oriented to person, place, and time. She has normal reflexes.  Skin: Skin is warm and dry.  Psychiatric: She has a normal mood and affect. Her behavior is normal. Judgment and thought content normal.    MAU Course  Procedures  MDM CCUA UPT Qualitative HCG CBC w/Diff BMP  Results for orders placed or performed during the hospital encounter of 05/01/18 (from the past 24 hour(s))  Urinalysis, Routine w reflex microscopic     Status: Abnormal   Collection Time: 05/01/18  8:03 PM  Result Value Ref Range   Color, Urine YELLOW YELLOW   APPearance CLOUDY (A) CLEAR   Specific Gravity, Urine 1.024 1.005 - 1.030   pH 6.0 5.0 - 8.0   Glucose, UA NEGATIVE NEGATIVE mg/dL   Hgb urine dipstick NEGATIVE NEGATIVE   Bilirubin Urine NEGATIVE  NEGATIVE   Ketones, ur 5 (A) NEGATIVE mg/dL   Protein, ur NEGATIVE NEGATIVE mg/dL   Nitrite NEGATIVE NEGATIVE   Leukocytes, UA NEGATIVE NEGATIVE  Pregnancy, urine POC     Status: None   Collection Time: 05/01/18  8:10 PM  Result Value Ref Range   Preg Test, Ur NEGATIVE NEGATIVE  CBC with Differential/Platelet     Status: Abnormal   Collection Time: 05/01/18 10:09 PM  Result Value Ref Range   WBC 14.7 (H) 4.0 - 10.5 K/uL   RBC 4.64 3.87 - 5.11 MIL/uL   Hemoglobin 13.2 12.0 - 15.0 g/dL   HCT 16.141.8 09.636.0 - 04.546.0 %   MCV 90.1 80.0 - 100.0 fL   MCH 28.4 26.0 - 34.0 pg   MCHC 31.6 30.0 - 36.0 g/dL   RDW 40.913.4 81.111.5 - 91.415.5 %   Platelets 360 150 - 400 K/uL   nRBC 0.0 0.0 -  0.2 %   Neutrophils Relative % 67 %   Neutro Abs 9.9 (H) 1.7 - 7.7 K/uL   Lymphocytes Relative 29 %   Lymphs Abs 4.2 (H) 0.7 - 4.0 K/uL   Monocytes Relative 3 %   Monocytes Absolute 0.5 0.1 - 1.0 K/uL   Eosinophils Relative 1 %   Eosinophils Absolute 0.1 0.0 - 0.5 K/uL   Basophils Relative 0 %   Basophils Absolute 0.0 0.0 - 0.1 K/uL  hCG, serum, qualitative     Status: None   Collection Time: 05/01/18 10:09 PM  Result Value Ref Range   Preg, Serum NEGATIVE NEGATIVE  Basic metabolic panel     Status: Abnormal   Collection Time: 05/01/18 10:09 PM  Result Value Ref Range   Sodium 137 135 - 145 mmol/L   Potassium 3.7 3.5 - 5.1 mmol/L   Chloride 106 98 - 111 mmol/L   CO2 25 22 - 32 mmol/L   Glucose, Bld 95 70 - 99 mg/dL   BUN 6 6 - 20 mg/dL   Creatinine, Ser 1.610.56 0.44 - 1.00 mg/dL   Calcium 8.3 (L) 8.9 - 10.3 mg/dL   GFR calc non Af Amer >60 >60 mL/min   GFR calc Af Amer >60 >60 mL/min   Anion gap 6 5 - 15    Assessment and Plan  Physically well but worried - Reassurance given that test results are negative today  Abdominal pain of unknown cause  - F/U with PCP on insurance card - Advised there is no medical indication for an ultrasound - PCP would need to order, if found to be medically necessary -  Discharge patient - Patient verbalized an understanding of the plan of care and agrees.   Raelyn Moraolitta Ceira Hoeschen, MSN, CNM 05/01/2018, 9:56 PM

## 2018-05-01 NOTE — MAU Note (Signed)
Pt presents to MAU stating she had 2positive pregnancy test on 12/19 and 2negitive test on 12/21. Pt states she has a nexplanon in place so her LMP was the last week of may. Pt reports no bleeding or vaginal discharge but complains about upper abdominal pain that feels like knots in her stomach. Pt states the pain makes it hard to eat and hard to sleep. Pt has lost two babies in the past and is afraid if she is pregnant the birthcontrol will make her loose this one.

## 2018-10-16 ENCOUNTER — Other Ambulatory Visit: Payer: Self-pay

## 2018-10-16 ENCOUNTER — Emergency Department (HOSPITAL_COMMUNITY): Payer: Medicaid Other

## 2018-10-16 ENCOUNTER — Encounter (HOSPITAL_COMMUNITY): Payer: Self-pay | Admitting: Emergency Medicine

## 2018-10-16 ENCOUNTER — Emergency Department (HOSPITAL_COMMUNITY)
Admission: EM | Admit: 2018-10-16 | Discharge: 2018-10-16 | Disposition: A | Discharge status: Home / Self Care | Payer: Medicaid Other | Attending: Emergency Medicine | Admitting: Emergency Medicine

## 2018-10-16 DIAGNOSIS — Y9259 Other trade areas as the place of occurrence of the external cause: Secondary | ICD-10-CM | POA: Diagnosis not present

## 2018-10-16 DIAGNOSIS — Z720 Tobacco use: Secondary | ICD-10-CM | POA: Insufficient documentation

## 2018-10-16 DIAGNOSIS — Y9389 Activity, other specified: Secondary | ICD-10-CM | POA: Insufficient documentation

## 2018-10-16 DIAGNOSIS — Z79899 Other long term (current) drug therapy: Secondary | ICD-10-CM | POA: Insufficient documentation

## 2018-10-16 DIAGNOSIS — Y990 Civilian activity done for income or pay: Secondary | ICD-10-CM | POA: Diagnosis not present

## 2018-10-16 DIAGNOSIS — S92425A Nondisplaced fracture of distal phalanx of left great toe, initial encounter for closed fracture: Secondary | ICD-10-CM

## 2018-10-16 DIAGNOSIS — X509XXA Other and unspecified overexertion or strenuous movements or postures, initial encounter: Secondary | ICD-10-CM | POA: Insufficient documentation

## 2018-10-16 DIAGNOSIS — S99922A Unspecified injury of left foot, initial encounter: Secondary | ICD-10-CM | POA: Diagnosis present

## 2018-10-16 MED ORDER — IBUPROFEN 400 MG PO TABS
600.0000 mg | ORAL_TABLET | Freq: Once | ORAL | Status: AC
  Administered 2018-10-16: 600 mg via ORAL
  Filled 2018-10-16: qty 1

## 2018-10-16 NOTE — ED Notes (Signed)
Patient transported to X-ray 

## 2018-10-16 NOTE — Progress Notes (Signed)
Orthopedic Tech Progress Note Patient Details:  Beth Goodwin 17-Jan-1996 [id number]  Ortho Devices Type of Ortho Device: Postop shoe/boot Ortho Device/Splint Location: lle Ortho Device/Splint Interventions: Ordered, Application, Adjustment   Post Interventions Patient Tolerated: Well Instructions Provided: Care of device, Adjustment of device   Karolee Stamps 10/16/2018, 11:41 PM

## 2018-10-16 NOTE — Discharge Instructions (Addendum)
Use the orthopedic shoe provided for at least the next 2 weeks.  Avoid any vigorous activities, running or jumping until pain completely resolved.  May take ibuprofen 600 mg every 6-8 hours as needed for pain.  Use ice pack provided and ice the area for 20 minutes 3 times daily for the next 3 days.  Follow-up with your in 2 weeks if pain persists.

## 2018-10-16 NOTE — ED Notes (Signed)
ED Provider at bedside. 

## 2018-10-16 NOTE — ED Provider Notes (Signed)
[facility] EMERGENCY DEPARTMENT Provider Note   CSN: [id number] Arrival date & time: 10/16/18  2031    History   Chief Complaint No chief complaint on file.   HPI Beth Goodwin is a 23 y.o. female.     23 year old female with no chronic medical conditions presents for evaluation of persistent left great toe pain and swelling bruising and numbness.  Patient reports she injured her left great toe 3 days ago while at work.  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  When she tried to get up she lost her balance and fell onto her left knee injured her left great toe.  She called her PCPs nurse triage line who advised ice therapy ibuprofen and Tylenol which she has been doing.  She reports her left knee pain is now resolved.  She still has persistent pain bruising and numbness of the left great toe so presented today for evaluation.  No fevers.  She has otherwise been well.  The history is provided by the patient.    Past Medical History:  Diagnosis Date  . Allergy   . GERD (gastroesophageal reflux disease)     Patient Active Problem List   Diagnosis Date Noted  . Physically well but worried 05/01/2018  . Abdominal pain of unknown cause 05/01/2018  . Nexplanon in place 09/06/2017  . Morbid obesity ([place]) 09/06/2017    Past Surgical History:  Procedure Laterality Date  . CESAREAN SECTION N/A 09/10/2014   Procedure: CESAREAN SECTION;  Surgeon: Mora Bellman, MD;  Location: [facility];  Service: Obstetrics;  Laterality: N/A;  . CHOLECYSTECTOMY N/A 11/02/2014   Procedure: LAPAROSCOPIC CHOLECYSTECTOMY WITH INTRAOPERATIVE CHOLANGIOGRAM;  Surgeon: Autumn Messing III, MD;  Location: [facility];  Service: General;  Laterality: N/A;     OB History    Gravida  2   Para  1   Term  1   Preterm      AB  1   Living  0     SAB  1   TAB      Ectopic      Multiple  0   Live Births  1            Home Medications    Prior to  Admission medications   Medication Sig Start Date End Date Taking? Authorizing Provider  dicyclomine (BENTYL) 20 MG tablet Take 1 tablet (20 mg total) by mouth 2 (two) times daily. Patient not taking: Reported on 11/28/2016 05/23/16   Delos Haring, PA-C  etonogestrel (NEXPLANON) 68 MG IMPL implant 1 each by Subdermal route once.    [provider]  ondansetron (ZOFRAN) 4 MG tablet Take 1 tablet (4 mg total) by mouth every 6 (six) hours. Patient not taking: Reported on 11/28/2016 05/23/16   Delos Haring, PA-C    Family History Family History  Problem Relation Age of Onset  . Hypertension Father   . Diabetes Father   . Hypertension Mother   . Diabetes Maternal Aunt   . Diabetes Maternal Grandmother   . Diabetes Paternal Grandmother     Social History Social History   Tobacco Use  . Smoking status: Current Some Day Smoker  . Smokeless tobacco: Never Used  Substance Use Topics  . Alcohol use: No  . Drug use: Yes    Types: Marijuana    Comment: odaily     Allergies   Strawberry flavor and Amoxicillin   Review of Systems Review of  Systems  All systems reviewed and were reviewed and were negative except as stated in the HPI  Physical Exam Updated Vital Signs BP 129/83 (BP Location: Right Arm)   Pulse 89   Temp 98.4 F (36.9 C) (Oral)   Resp 18   LMP 05/11/2018   SpO2 100%   Physical Exam Vitals signs and nursing note reviewed.  Constitutional:      General: She is not in acute distress.    Appearance: Normal appearance. She is well-developed. She is obese.  HENT:     Head: Normocephalic and atraumatic.     Nose: Nose normal.     Mouth/Throat:     Pharynx: No oropharyngeal exudate.  Eyes:     Conjunctiva/sclera: Conjunctivae normal.     Pupils: Pupils are equal, round, and reactive to light.  Neck:     Musculoskeletal: Normal range of motion and neck supple.  Cardiovascular:     Rate and Rhythm: Normal rate and regular rhythm.     Heart sounds:  Normal heart sounds. No murmur. No friction rub. No gallop.   Pulmonary:     Effort: Pulmonary effort is normal. No respiratory distress.     Breath sounds: No wheezing or rales.  Abdominal:     General: Bowel sounds are normal.     Palpations: Abdomen is soft.     Tenderness: There is no abdominal tenderness. There is no guarding or rebound.  Musculoskeletal: Normal range of motion.        General: Swelling and tenderness present.     Comments: No tenderness of the left thigh, knee, or lower leg.  No left ankle or foot tenderness.  Normal range of motion flexion extension of the left knee.  No joint line tenderness.  There is soft tissue swelling and tenderness of the left great toe with contusion.  Nail is intact.  Skin:    General: Skin is warm and dry.     Findings: No rash.  Neurological:     Mental Status: She is alert and oriented to person, place, and time.     Cranial Nerves: No cranial nerve deficit.     Comments: Normal strength 5/5 in upper and lower extremities, normal coordination      ED Treatments / Results  Labs (all labs ordered are listed, but only abnormal results are displayed) Labs Reviewed - No data to display  EKG None  Radiology Dg Toe Great Left  Result Date: 10/16/2018 CLINICAL DATA:  Initial evaluation for acute pain and swelling, recent injury. EXAM: LEFT GREAT TOE COMPARISON:  None. FINDINGS: No acute fracture or dislocation. Faint curvilinear lucency extending through the base of the first distal phalanx felt to be most consistent with a nutrient foramen. Joint spaces maintained without evidence for significant degenerative or erosive arthropathy. No visible soft tissue injury. IMPRESSION: No acute osseous abnormality about the left great toe. Electronically Signed   By: Rise MuBenjamin  McClintock M.D.   On: 10/16/2018 22:58    Procedures Procedures (including critical care time)  Medications Ordered in ED Medications  ibuprofen (ADVIL) tablet 600 mg  (600 mg Oral Given 10/16/18 2305)     Initial Impression / Assessment and Plan / ED Course  I have reviewed the triage vital signs and the nursing notes.  Pertinent labs & imaging results that were available during my care of the patient were reviewed by me and considered in my medical decision making (see chart for details).       23 year old  female with injury to the left great toe 3 days ago.  Injury occurred during a fall at work.  Had transient left knee pain as well which has since resolved.  No fevers.  On exam here vitals normal and she is well-appearing.  She has focal tenderness with mild soft tissue swelling and contusion of the left great toe.  The nail is intact.  We will give ibuprofen, apply ice pack and obtain x-rays of the left great toe.  Will reassess.  X-rays of the left great toe show faint irregularity at the base of the distal phalanx.  Felt by radiology to represent nutrient foramen.  However, patient does have focal tenderness here along with soft tissue swelling and contusion so we will treat as possible mild nondisplaced fracture.  Will place her in a postop shoe.  Recommend continued ibuprofen and ice therapy.  PCP follow-up in 2 weeks if pain persist with return precautions as outlined the discharge instructions.  Final Clinical Impressions(s) / ED Diagnoses   Final diagnoses:  Closed nondisplaced fracture of distal phalanx of left great toe, initial encounter    ED Discharge Orders    None       Ree Shayeis, Marshella Tello, MD 10/16/18 2330

## 2018-10-16 NOTE — ED Triage Notes (Signed)
Reports on Thursday at work she tripped and fell, injured her left big toe.  Mild swelling noted. Intermittent numbness to toe. Pulses present.

## 2018-10-16 NOTE — ED Notes (Signed)
Ortho tech at bedside 

## 2019-02-08 ENCOUNTER — Other Ambulatory Visit (HOSPITAL_COMMUNITY)
Admission: RE | Admit: 2019-02-08 | Discharge: 2019-02-08 | Disposition: A | Discharge status: Home / Self Care | Payer: Medicaid Other | Source: Ambulatory Visit | Attending: Obstetrics & Gynecology | Admitting: Obstetrics & Gynecology

## 2019-02-08 ENCOUNTER — Ambulatory Visit (INDEPENDENT_AMBULATORY_CARE_PROVIDER_SITE_OTHER): Payer: Medicaid Other | Admitting: Obstetrics & Gynecology

## 2019-02-08 ENCOUNTER — Encounter: Payer: Self-pay | Admitting: Obstetrics & Gynecology

## 2019-02-08 ENCOUNTER — Other Ambulatory Visit: Payer: Self-pay

## 2019-02-08 VITALS — BP 123/85 | HR 73 | Wt 258.8 lb

## 2019-02-08 DIAGNOSIS — Z01419 Encounter for gynecological examination (general) (routine) without abnormal findings: Secondary | ICD-10-CM

## 2019-02-08 DIAGNOSIS — Z0000 Encounter for general adult medical examination without abnormal findings: Secondary | ICD-10-CM

## 2019-02-08 DIAGNOSIS — Z975 Presence of (intrauterine) contraceptive device: Secondary | ICD-10-CM

## 2019-02-08 DIAGNOSIS — B9689 Other specified bacterial agents as the cause of diseases classified elsewhere: Secondary | ICD-10-CM

## 2019-02-08 NOTE — Patient Instructions (Signed)
Preventive Care 21-22 Years Old, Female Preventive care refers to visits with your health care provider and lifestyle choices that can promote health and wellness. This includes:  A yearly physical exam. This may also be called an annual well check.  Regular dental visits and eye exams.  Immunizations.  Screening for certain conditions.  Healthy lifestyle choices, such as eating a healthy diet, getting regular exercise, not using drugs or products that contain nicotine and tobacco, and limiting alcohol use. What can I expect for my preventive care visit? Physical exam Your health care provider will check your:  Height and weight. This may be used to calculate body mass index (BMI), which tells if you are at a healthy weight.  Heart rate and blood pressure.  Skin for abnormal spots. Counseling Your health care provider may ask you questions about your:  Alcohol, tobacco, and drug use.  Emotional well-being.  Home and relationship well-being.  Sexual activity.  Eating habits.  Work and work environment.  Method of birth control.  Menstrual cycle.  Pregnancy history. What immunizations do I need?  Influenza (flu) vaccine  This is recommended every year. Tetanus, diphtheria, and pertussis (Tdap) vaccine  You may need a Td booster every 10 years. Varicella (chickenpox) vaccine  You may need this if you have not been vaccinated. Human papillomavirus (HPV) vaccine  If recommended by your health care provider, you may need three doses over 6 months. Measles, mumps, and rubella (MMR) vaccine  You may need at least one dose of MMR. You may also need a second dose. Meningococcal conjugate (MenACWY) vaccine  One dose is recommended if you are age 19-21 years and a first-year college student living in a residence hall, or if you have one of several medical conditions. You may also need additional booster doses. Pneumococcal conjugate (PCV13) vaccine  You may need  this if you have certain conditions and were not previously vaccinated. Pneumococcal polysaccharide (PPSV23) vaccine  You may need one or two doses if you smoke cigarettes or if you have certain conditions. Hepatitis A vaccine  You may need this if you have certain conditions or if you travel or work in places where you may be exposed to hepatitis A. Hepatitis B vaccine  You may need this if you have certain conditions or if you travel or work in places where you may be exposed to hepatitis B. Haemophilus influenzae type b (Hib) vaccine  You may need this if you have certain conditions. You may receive vaccines as individual doses or as more than one vaccine together in one shot (combination vaccines). Talk with your health care provider about the risks and benefits of combination vaccines. What tests do I need?  Blood tests  Lipid and cholesterol levels. These may be checked every 5 years starting at age 20.  Hepatitis C test.  Hepatitis B test. Screening  Diabetes screening. This is done by checking your blood sugar (glucose) after you have not eaten for a while (fasting).  Sexually transmitted disease (STD) testing.  BRCA-related cancer screening. This may be done if you have a family history of breast, ovarian, tubal, or peritoneal cancers.  Pelvic exam and Pap test. This may be done every 3 years starting at age 21. Starting at age 30, this may be done every 5 years if you have a Pap test in combination with an HPV test. Talk with your health care provider about your test results, treatment options, and if necessary, the need for more tests.   Follow these instructions at home: Eating and drinking   Eat a diet that includes fresh fruits and vegetables, whole grains, lean protein, and low-fat dairy.  Take vitamin and mineral supplements as recommended by your health care provider.  Do not drink alcohol if: ? Your health care provider tells you not to drink. ? You are  pregnant, may be pregnant, or are planning to become pregnant.  If you drink alcohol: ? Limit how much you have to 0-1 drink a day. ? Be aware of how much alcohol is in your drink. In the [place] one drink equals one 12 oz bottle of beer (355 mL), one 5 oz glass of wine (148 mL), or one 1 oz glass of hard liquor (44 mL). Lifestyle  Take daily care of your teeth and gums.  Stay active. Exercise for at least 30 minutes on 5 or more days each week.  Do not use any products that contain nicotine or tobacco, such as cigarettes, e-cigarettes, and chewing tobacco. If you need help quitting, ask your health care provider.  If you are sexually active, practice safe sex. Use a condom or other form of birth control (contraception) in order to prevent pregnancy and STIs (sexually transmitted infections). If you plan to become pregnant, see your health care provider for a preconception visit. What's next?  Visit your health care provider once a year for a well check visit.  Ask your health care provider how often you should have your eyes and teeth checked.  Stay up to date on all vaccines. This information is not intended to replace advice given to you by your health care provider. Make sure you discuss any questions you have with your health care provider. Document Released: 06/23/2001 Document Revised: 01/06/2018 Document Reviewed: 01/06/2018 Elsevier Patient Education  2020 Elsevier Inc.  

## 2019-02-08 NOTE — Progress Notes (Signed)
GYNECOLOGY ANNUAL PREVENTATIVE CARE ENCOUNTER NOTE  History:     Beth Goodwin is a 23 y.o. G54P1010 female here for a routine annual gynecologic exam.  Current complaints: none.   Denies abnormal vaginal bleeding, discharge, pelvic pain, problems with intercourse or other gynecologic concerns.    Gynecologic History No LMP recorded. Patient has had an implant. Contraception: Nexplanon placed 12/03/2017  Obstetric History OB History  Gravida Para Term Preterm AB Living  2 1 1   1  0  SAB TAB Ectopic Multiple Live Births  1     0 1    # Outcome Date GA Lbr Len/2nd Weight Sex Delivery Anes PTL Lv  2 Term 09/10/14 [redacted]w[redacted]d  6 lb 4.5 oz (2.85 kg) F CS-LTranv Gen  DEC     Birth Comments: Fetal bradycardia. Coded at delivery. Seizure noted. Infant was cooled per protocol. Upon rewarming, IY [redacted]w[redacted]d was noted to have significant neurological deficits (no gag reflex) and was thus transferred to [facility] NICU  1 SAB             Past Medical History:  Diagnosis Date  . Allergy   . GERD (gastroesophageal reflux disease)     Past Surgical History:  Procedure Laterality Date  . CESAREAN SECTION N/A 09/10/2014   Procedure: CESAREAN SECTION;  Surgeon: 11/10/2014, MD;  Location: [facility];  Service: Obstetrics;  Laterality: N/A;  . CHOLECYSTECTOMY N/A 11/02/2014   Procedure: LAPAROSCOPIC CHOLECYSTECTOMY WITH INTRAOPERATIVE CHOLANGIOGRAM;  Surgeon: 11/04/2014 III, MD;  Location: [facility];  Service: General;  Laterality: N/A;    Current Outpatient Medications on File Prior to Visit  Medication Sig Dispense Refill  . etonogestrel (NEXPLANON) 68 MG IMPL implant 1 each by Subdermal route once.    . dicyclomine (BENTYL) 20 MG tablet Take 1 tablet (20 mg total) by mouth 2 (two) times daily. (Patient not taking: Reported on 11/28/2016) 10 tablet 0  . ondansetron (ZOFRAN) 4 MG tablet Take 1 tablet (4 mg total) by mouth every 6 (six) hours. (Patient not taking: Reported on 11/28/2016) 12 tablet 0   No  current facility-administered medications on file prior to visit.     Allergies  Allergen Reactions  . Strawberry Flavor Itching  . Amoxicillin Hives    Has patient had a PCN reaction causing immediate rash, facial/tongue/throat swelling, SOB or lightheadedness with hypotension: Yes Has patient had a PCN reaction causing severe rash involving mucus membranes or skin necrosis: No Has patient had a PCN reaction that required hospitalization: No Has patient had a PCN reaction occurring within the last 10 years: No If all of the above answers are "NO", then may proceed with Cephalosporin use.     Social History:  reports that she has been smoking. She has never used smokeless tobacco. She reports current drug use. Drug: Marijuana. She reports that she does not drink alcohol.  Family History  Problem Relation Age of Onset  . Hypertension Father   . Diabetes Father   . Hypertension Mother   . Diabetes Maternal Aunt   . Diabetes Maternal Grandmother   . Diabetes Paternal Grandmother     The following portions of the patient's history were reviewed and updated as appropriate: allergies, current medications, past family history, past medical history, past social history, past surgical history and problem list.  Review of Systems Pertinent items noted in HPI and remainder of comprehensive ROS otherwise negative.  Physical Exam:  BP 123/85   Pulse 73   Wt 258  lb 12.8 oz (117.4 kg)   BMI 44.42 kg/m  CONSTITUTIONAL: Well-developed, well-nourished female in no acute distress.  HENT:  Normocephalic, atraumatic, External right and left ear normal. Oropharynx is clear and moist EYES: Conjunctivae and EOM are normal. Pupils are equal, round, and reactive to light. No scleral icterus.  NECK: Normal range of motion, supple, no masses.  Normal thyroid.  SKIN: Skin is warm and dry. No rash noted. Not diaphoretic. No erythema. No pallor. MUSCULOSKELETAL: Normal range of motion. No tenderness.  No  cyanosis, clubbing, or edema.  2+ distal pulses. NEUROLOGIC: Alert and oriented to person, place, and time. Normal reflexes, muscle tone coordination. No cranial nerve deficit noted. PSYCHIATRIC: Normal mood and affect. Normal behavior. Normal judgment and thought content. CARDIOVASCULAR: Normal heart rate noted, regular rhythm RESPIRATORY: Clear to auscultation bilaterally. Effort and breath sounds normal, no problems with respiration noted. BREASTS: Symmetric in size. No masses, skin changes, nipple drainage, or lymphadenopathy. ABDOMEN: Soft, normal bowel sounds, no distention noted.  No tenderness, rebound or guarding.  PELVIC: Normal appearing external genitalia; normal appearing vaginal mucosa and cervix.  No abnormal discharge noted.  Pap smear obtained.  Normal uterine size, no other palpable masses, no uterine or adnexal tenderness.   Assessment and Plan:    1. Nexplanon in place, replaced 12/03/2017 No significant issues.   2. Well woman exam with routine gynecological exam - Cervicovaginal ancillary only( [place]) - HIV Antibody (routine testing w rflx) - RPR - Hepatitis B surface antigen - Hepatitis C antibody - Cytology - PAP( [place]) Will follow up results of pap smear and results, and manage accordingly.d Routine preventative health maintenance measures emphasized. Please refer to After Visit Summary for other counseling recommendations.      Verita Schneiders, MD, [place] for [employer], [place]

## 2019-02-09 LAB — CERVICOVAGINAL ANCILLARY ONLY
Bacterial Vaginitis (gardnerella): POSITIVE — AB
Candida Glabrata: NEGATIVE
Candida Vaginitis: NEGATIVE
Chlamydia: NEGATIVE
Molecular Disclaimer: NEGATIVE
Molecular Disclaimer: NEGATIVE
Molecular Disclaimer: NEGATIVE
Molecular Disclaimer: NEGATIVE
Molecular Disclaimer: NORMAL
Molecular Disclaimer: NORMAL
Neisseria Gonorrhea: NEGATIVE
Trichomonas: NEGATIVE

## 2019-02-09 LAB — HIV ANTIBODY (ROUTINE TESTING W REFLEX): HIV Screen 4th Generation wRfx: NONREACTIVE

## 2019-02-09 LAB — HEPATITIS B SURFACE ANTIGEN: Hepatitis B Surface Ag: NEGATIVE

## 2019-02-09 LAB — HEPATITIS C ANTIBODY: Hep C Virus Ab: <0.1 s/co ratio (ref 0.0–0.9)

## 2019-02-09 LAB — RPR: RPR Ser Ql: NONREACTIVE

## 2019-02-09 MED ORDER — METRONIDAZOLE 500 MG PO TABS: 500.0000 mg | ORAL_TABLET | Freq: Two times a day (BID) | ORAL | 0 refills | Status: DC

## 2019-02-09 NOTE — Addendum Note (Signed)
Addended by: Verita Schneiders A on: 02/09/2019 04:36 PM   Modules accepted: Orders

## 2019-02-10 ENCOUNTER — Telehealth: Payer: Medicaid Other | Admitting: Nurse Practitioner

## 2019-02-10 DIAGNOSIS — B373 Candidiasis of vulva and vagina: Secondary | ICD-10-CM

## 2019-02-10 DIAGNOSIS — B3731 Acute candidiasis of vulva and vagina: Secondary | ICD-10-CM

## 2019-02-10 LAB — CYTOLOGY - PAP: Diagnosis: NEGATIVE

## 2019-02-10 MED ORDER — FLUCONAZOLE 150 MG PO TABS: 150.0000 mg | ORAL_TABLET | Freq: Once | ORAL | 0 refills | Status: AC

## 2019-02-10 NOTE — Progress Notes (Signed)

## 2019-04-14 ENCOUNTER — Other Ambulatory Visit: Payer: Self-pay

## 2019-04-14 ENCOUNTER — Emergency Department (HOSPITAL_COMMUNITY)
Admission: EM | Admit: 2019-04-14 | Discharge: 2019-04-14 | Disposition: A | Discharge status: Home / Self Care | Payer: Medicaid Other | Attending: Emergency Medicine | Admitting: Emergency Medicine

## 2019-04-14 DIAGNOSIS — M7918 Myalgia, other site: Secondary | ICD-10-CM | POA: Diagnosis present

## 2019-04-14 DIAGNOSIS — B349 Viral infection, unspecified: Secondary | ICD-10-CM

## 2019-04-14 DIAGNOSIS — F1210 Cannabis abuse, uncomplicated: Secondary | ICD-10-CM | POA: Insufficient documentation

## 2019-04-14 DIAGNOSIS — M549 Dorsalgia, unspecified: Secondary | ICD-10-CM | POA: Insufficient documentation

## 2019-04-14 DIAGNOSIS — Z20828 Contact with and (suspected) exposure to other viral communicable diseases: Secondary | ICD-10-CM | POA: Diagnosis not present

## 2019-04-14 DIAGNOSIS — Z20822 Contact with and (suspected) exposure to covid-19: Secondary | ICD-10-CM

## 2019-04-14 DIAGNOSIS — F17200 Nicotine dependence, unspecified, uncomplicated: Secondary | ICD-10-CM | POA: Insufficient documentation

## 2019-04-14 DIAGNOSIS — R070 Pain in throat: Secondary | ICD-10-CM | POA: Insufficient documentation

## 2019-04-14 DIAGNOSIS — R05 Cough: Secondary | ICD-10-CM | POA: Insufficient documentation

## 2019-04-14 DIAGNOSIS — R519 Headache, unspecified: Secondary | ICD-10-CM | POA: Diagnosis not present

## 2019-04-14 MED ORDER — KETOROLAC TROMETHAMINE 30 MG/ML IJ SOLN
30.0000 mg | Freq: Once | INTRAMUSCULAR | Status: AC
  Administered 2019-04-14: 30 mg via INTRAVENOUS
  Filled 2019-04-14: qty 1

## 2019-04-14 MED ORDER — METOCLOPRAMIDE HCL 5 MG/ML IJ SOLN
10.0000 mg | Freq: Once | INTRAMUSCULAR | Status: AC
  Administered 2019-04-14: 10 mg via INTRAVENOUS
  Filled 2019-04-14: qty 2

## 2019-04-14 MED ORDER — ACETAMINOPHEN 325 MG PO TABS: 650.0000 mg | ORAL_TABLET | Freq: Once | ORAL | Status: DC

## 2019-04-14 NOTE — ED Triage Notes (Signed)
Pt c/o cough, headache, and abdominal pain x2 days. Pt denies N/V/D, loss of taste or smell, fever, SHOB or CP.

## 2019-04-14 NOTE — Discharge Instructions (Addendum)
You have been tested for coronavirus today.  Follow-up on these test results within 24 hours.  You should remain in quarantine until you receive your test results.  If you test positive for Covid, continue your quarantine for 14 days or until symptoms have resolved for 72 hours -which ever is longer.  Follow-up with a primary care doctor as needed and return if symptoms worsen.

## 2019-04-14 NOTE — ED Provider Notes (Signed)
[facility] DEPT Provider Note   CSN: [id number] Arrival date & time: 04/14/19  0018     History   Chief Complaint Chief Complaint  Patient presents with  . Cough  . Headache    HPI Beth Goodwin is a 23 y.o. female.     23 year old female with a history of esophageal reflux presents to the emergency department for URI symptoms x2 days.  She reports body aches as well as backache and headache.  Symptoms further associated with a nonproductive cough and sore throat.  She has not taken any medications for her symptoms.  Is unsure of any Covid positive exposure, but her boyfriend is also being seen today for similar complaints.  She works at [employer]; wears a mask while at work.  Denies fever, nausea, vomiting, diarrhea, loss of smell or taste, shortness of breath, chest pain.  The history is provided by the patient. No language interpreter was used.  Cough Associated symptoms: headaches   Headache Associated symptoms: cough     Past Medical History:  Diagnosis Date  . Allergy   . GERD (gastroesophageal reflux disease)     Patient Active Problem List   Diagnosis Date Noted  . Physically well but worried 05/01/2018  . Abdominal pain of unknown cause 05/01/2018  . Nexplanon in place, replaced 12/03/17 09/06/2017  . Morbid obesity ([place]) 09/06/2017    Past Surgical History:  Procedure Laterality Date  . CESAREAN SECTION N/A 09/10/2014   Procedure: CESAREAN SECTION;  Surgeon: Mora Bellman, MD;  Location: [facility];  Service: Obstetrics;  Laterality: N/A;  . CHOLECYSTECTOMY N/A 11/02/2014   Procedure: LAPAROSCOPIC CHOLECYSTECTOMY WITH INTRAOPERATIVE CHOLANGIOGRAM;  Surgeon: Autumn Messing III, MD;  Location: [facility];  Service: General;  Laterality: N/A;     OB History    Gravida  2   Para  1   Term  1   Preterm      AB  1   Living  0     SAB  1   TAB      Ectopic      Multiple  0   Live Births  1            Home  Medications    Prior to Admission medications   Medication Sig Start Date End Date Taking? Authorizing Provider  etonogestrel (NEXPLANON) 68 MG IMPL implant 1 each by Subdermal route once.   Yes [provider]  dicyclomine (BENTYL) 20 MG tablet Take 1 tablet (20 mg total) by mouth 2 (two) times daily. Patient not taking: Reported on 11/28/2016 05/23/16 04/14/19  Delos Haring, PA-C    Family History Family History  Problem Relation Age of Onset  . Hypertension Father   . Diabetes Father   . Hypertension Mother   . Diabetes Maternal Aunt   . Diabetes Maternal Grandmother   . Diabetes Paternal Grandmother     Social History Social History   Tobacco Use  . Smoking status: Current Some Day Smoker  . Smokeless tobacco: Never Used  Substance Use Topics  . Alcohol use: No  . Drug use: Yes    Types: Marijuana    Comment: odaily     Allergies   Amoxicillin   Review of Systems Review of Systems  Respiratory: Positive for cough.   Neurological: Positive for headaches.  Ten systems reviewed and are negative for acute change, except as noted in the HPI.    Physical Exam Updated Vital Signs BP 135/79 (BP  Location: Left Arm)   Pulse 81   Temp 98.2 F (36.8 C) (Oral)   Resp 18   SpO2 100%   Physical Exam Vitals signs and nursing note reviewed.  Constitutional:      General: She is not in acute distress.    Appearance: She is well-developed. She is not diaphoretic.     Comments: Nontoxic-appearing and in no distress  HENT:     Head: Normocephalic and atraumatic.  Eyes:     General: No scleral icterus.    Conjunctiva/sclera: Conjunctivae normal.  Neck:     Musculoskeletal: Normal range of motion.     Comments: No nuchal rigidity or meningismus Cardiovascular:     Rate and Rhythm: Normal rate and regular rhythm.     Pulses: Normal pulses.  Pulmonary:     Effort: Pulmonary effort is normal. No respiratory distress.     Comments: Respirations even and  unlabored Musculoskeletal: Normal range of motion.  Skin:    General: Skin is warm and dry.     Coloration: Skin is not pale.     Findings: No erythema or rash.  Neurological:     General: No focal deficit present.     Mental Status: She is alert and oriented to person, place, and time.     Coordination: Coordination normal.     Comments: GCS 15.  Speech is goal oriented.  No focal deficits appreciated.  Moving all extremities spontaneously.  Psychiatric:        Behavior: Behavior normal.      ED Treatments / Results  Labs (all labs ordered are listed, but only abnormal results are displayed) Labs Reviewed  NOVEL CORONAVIRUS, NAA (HOSP ORDER, SEND-OUT TO REF LAB; TAT 18-24 HRS)    EKG None  Radiology No results found.  Procedures Procedures (including critical care time)  Medications Ordered in ED Medications  acetaminophen (TYLENOL) tablet 650 mg (has no administration in time range)  ketorolac (TORADOL) 30 MG/ML injection 30 mg (has no administration in time range)  metoCLOPramide (REGLAN) injection 10 mg (has no administration in time range)     Initial Impression / Assessment and Plan / ED Course  I have reviewed the triage vital signs and the nursing notes.  Pertinent labs & imaging results that were available during my care of the patient were reviewed by me and considered in my medical decision making (see chart for details).        Patient's symptoms are consistent with likely viral illness. Discussed that antibiotics are not indicated for viral infections. Patient will be discharged with symptomatic treatment.Patient with Covid test pending. She has been instructed to follow-up on these results in the next 1 to 2 days. Expressed need to quarantine until she receives her test results. She verbalizes understanding and is agreeable with plan. Patient is hemodynamically stableandin NAD prior to discharge.  Beth Goodwin was evaluated in  Emergency Department on 04/14/2019 for the symptoms described in the history of present illness. She was evaluated in the context of the global COVID-19 pandemic, which necessitated consideration that the patient might be at risk for infection with the SARS-CoV-2 virus that causes COVID-19. Institutional protocols and algorithms that pertain to the evaluation of patients at risk for COVID-19 are in a state of rapid change based on information released by regulatory bodies including the CDC and federal and state organizations. These policies and algorithms were followed during the patient's care in the ED.   Final Clinical Impressions(s) / ED  Diagnoses   Final diagnoses:  Viral illness  Encounter for laboratory testing for COVID-19 virus    ED Discharge Orders    None       Antony Madura, PA-C 04/14/19 0246    Dione Booze, MD 04/14/19 [phone]

## 2019-04-15 LAB — NOVEL CORONAVIRUS, NAA (HOSP ORDER, SEND-OUT TO REF LAB; TAT 18-24 HRS): SARS-CoV-2, NAA: NOT DETECTED

## 2019-06-15 ENCOUNTER — Other Ambulatory Visit: Payer: Self-pay | Admitting: Physician Assistant

## 2019-06-15 DIAGNOSIS — R1013 Epigastric pain: Secondary | ICD-10-CM

## 2019-06-21 ENCOUNTER — Ambulatory Visit
Admission: RE | Admit: 2019-06-21 | Discharge: 2019-06-21 | Disposition: A | Discharge status: Home / Self Care | Payer: Medicaid Other | Source: Ambulatory Visit | Attending: Physician Assistant | Admitting: Physician Assistant

## 2019-06-21 DIAGNOSIS — R1013 Epigastric pain: Secondary | ICD-10-CM

## 2019-08-12 ENCOUNTER — Telehealth: Payer: 59

## 2019-10-08 ENCOUNTER — Other Ambulatory Visit: Payer: Self-pay

## 2019-10-08 ENCOUNTER — Emergency Department (HOSPITAL_COMMUNITY)
Admission: EM | Admit: 2019-10-08 | Discharge: 2019-10-09 | Disposition: A | Discharge status: Home / Self Care | Payer: Medicaid Other | Attending: Emergency Medicine | Admitting: Emergency Medicine

## 2019-10-08 ENCOUNTER — Emergency Department (HOSPITAL_COMMUNITY): Payer: Medicaid Other

## 2019-10-08 ENCOUNTER — Encounter (HOSPITAL_COMMUNITY): Payer: Self-pay | Admitting: Emergency Medicine

## 2019-10-08 DIAGNOSIS — M25561 Pain in right knee: Secondary | ICD-10-CM

## 2019-10-08 DIAGNOSIS — Z720 Tobacco use: Secondary | ICD-10-CM | POA: Insufficient documentation

## 2019-10-08 DIAGNOSIS — F1290 Cannabis use, unspecified, uncomplicated: Secondary | ICD-10-CM | POA: Diagnosis not present

## 2019-10-08 LAB — POC URINE PREG, ED: Preg Test, Ur: NEGATIVE

## 2019-10-08 MED ORDER — KETOROLAC TROMETHAMINE 60 MG/2ML IM SOLN
60.0000 mg | Freq: Once | INTRAMUSCULAR | Status: AC
  Administered 2019-10-08: 60 mg via INTRAMUSCULAR
  Filled 2019-10-08: qty 2

## 2019-10-08 MED ORDER — IBUPROFEN 800 MG PO TABS: 800.0000 mg | ORAL_TABLET | Freq: Three times a day (TID) | ORAL | 0 refills | Status: DC

## 2019-10-08 NOTE — Discharge Instructions (Addendum)
Please take the prescribed ibuprofen for pain. Continue using ice/heat for pain relief. Your x-ray overall was reassuring today, did not show any signs of injury. I doubt that you have an infection of the knee. If your symptoms not improve, please follow-up with your primary care doctor. Return to the ER if your symptoms worsen.

## 2019-10-08 NOTE — ED Provider Notes (Signed)
[facility] EMERGENCY DEPARTMENT Provider Note   CSN: [id number] Arrival date & time: 10/08/19  1831     History Chief Complaint  Patient presents with  . Knee Pain    Beth Goodwin is a 24 y.o. female.  HPI 24 year old female with a history of morbid obesity presents to the ER for right knee pain x1 week.  Patient reports that she noticed a gradual onset of right knee pain, more in the inside with ambulation.  The pain has gradually gotten worse, she states that it is worse with walking.  She is still been able to bear weight but is extremely painful.  She states now that it is also painful when she has it in an extended position.  She reports some intermittent swelling to the right knee as well.  No known injury or falls.  No fevers, chills, she has not noticed any warmth to the area.  No history of gout, cancer/IVDU.     Past Medical History:  Diagnosis Date  . Allergy   . GERD (gastroesophageal reflux disease)     Patient Active Problem List   Diagnosis Date Noted  . Physically well but worried 05/01/2018  . Abdominal pain of unknown cause 05/01/2018  . Nexplanon in place, replaced 12/03/17 09/06/2017  . Morbid obesity ([facility]) 09/06/2017    Past Surgical History:  Procedure Laterality Date  . CESAREAN SECTION N/A 09/10/2014   Procedure: CESAREAN SECTION;  Surgeon: Catalina Antigua, MD;  Location: [facility];  Service: Obstetrics;  Laterality: N/A;  . CHOLECYSTECTOMY N/A 11/02/2014   Procedure: LAPAROSCOPIC CHOLECYSTECTOMY WITH INTRAOPERATIVE CHOLANGIOGRAM;  Surgeon: Chevis Pretty III, MD;  Location: [facility];  Service: General;  Laterality: N/A;     OB History    Gravida  2   Para  1   Term  1   Preterm      AB  1   Living  0     SAB  1   TAB      Ectopic      Multiple  0   Live Births  1           Family History  Problem Relation Age of Onset  . Hypertension Father   . Diabetes Father   . Hypertension Mother   . Diabetes Maternal  Aunt   . Diabetes Maternal Grandmother   . Diabetes Paternal Grandmother     Social History   Tobacco Use  . Smoking status: Current Some Day Smoker  . Smokeless tobacco: Never Used  Substance Use Topics  . Alcohol use: No  . Drug use: Yes    Types: Marijuana    Comment: odaily    Home Medications Prior to Admission medications   Medication Sig Start Date End Date Taking? Authorizing Provider  etonogestrel (NEXPLANON) 68 MG IMPL implant 1 each by Subdermal route once.    [provider]  ibuprofen (ADVIL) 800 MG tablet Take 1 tablet (800 mg total) by mouth 3 (three) times daily. 10/08/19   Mare Ferrari, PA-C  dicyclomine (BENTYL) 20 MG tablet Take 1 tablet (20 mg total) by mouth 2 (two) times daily. Patient not taking: Reported on 11/28/2016 05/23/16 04/14/19  Marlon Pel, PA-C    Allergies    Amoxicillin  Review of Systems   Review of Systems  Constitutional: Negative for chills and fever.  Musculoskeletal: Positive for arthralgias, gait problem and joint swelling. Negative for neck pain and neck stiffness.  Skin: Negative for color  change, rash and wound.  Neurological: Negative for weakness and numbness.    Physical Exam Updated Vital Signs BP 115/73 (BP Location: Left Arm)   Pulse 87   Temp 97.7 F (36.5 C) (Oral)   Resp 18   SpO2 100%   Physical Exam Vitals and nursing note reviewed.  Constitutional:      General: She is not in acute distress.    Appearance: She is well-developed. She is obese. She is not ill-appearing or diaphoretic.  HENT:     Head: Normocephalic and atraumatic.  Eyes:     Conjunctiva/sclera: Conjunctivae normal.  Cardiovascular:     Rate and Rhythm: Normal rate and regular rhythm.     Heart sounds: No murmur.  Pulmonary:     Effort: Pulmonary effort is normal. No respiratory distress.     Breath sounds: Normal breath sounds.  Abdominal:     Palpations: Abdomen is soft.     Tenderness: There is no abdominal tenderness.   Musculoskeletal:        General: No swelling, tenderness, deformity or signs of injury.     Cervical back: Neck supple.     Right lower leg: No edema.     Left lower leg: No edema.     Comments: 5/5 strength in lower extremities bilaterally.  Full range of motion of knee, though with pain.  No overlying erythema, warmth, or swelling.  Nontender to palpation.  No noticeable crepitus, step-offs, deformities.  Skin:    General: Skin is warm and dry.     Findings: No erythema or rash.  Neurological:     General: No focal deficit present.     Mental Status: She is alert and oriented to person, place, and time.     Motor: No weakness.     ED Results / Procedures / Treatments   Labs (all labs ordered are listed, but only abnormal results are displayed) Labs Reviewed  POC URINE PREG, ED    EKG None  Radiology DG Knee Complete 4 Views Right  Result Date: 10/08/2019 CLINICAL DATA:  C/o R knee pain and swelling x 1 week. No known injury. EXAM: RIGHT KNEE - COMPLETE 4+ VIEW COMPARISON:  None. FINDINGS: No evidence of fracture, dislocation, or joint effusion. No evidence of arthropathy or other focal bone abnormality. Soft tissues are unremarkable. IMPRESSION: Negative radiographs of the right knee. Electronically Signed   By: Audie Pinto M.D.   On: 10/08/2019 20:23    Procedures Procedures (including critical care time)  Medications Ordered in ED Medications  ketorolac (TORADOL) injection 60 mg (60 mg Intramuscular Given 10/08/19 2128)    ED Course  I have reviewed the triage vital signs and the nursing notes.  Pertinent labs & imaging results that were available during my care of the patient were reviewed by me and considered in my medical decision making (see chart for details).    MDM Rules/Calculators/A&P                      24 year old with right knee pain x1 week. On presentation, the patient is alert and oriented, nontoxic-appearing, no acute distress.  Vitals  overall reassuring.  Physical exam with pain on range of motion, but no noticeable erythema, swelling, warmth to the knee.  Patient denies any IV drug use, no history of gout.  No concern for septic arthritis.  Pain treated in the ER with Toradol.  Pending normal x-ray, dissipate stable for discharge with close follow-up with  Ortho and anti-inflammatories.  X-ray without acute abnormalities.  Will DC with 800 mg ibuprofen, encourage the patient to continue using ice/heat.  If symptoms not improve, encouraged her to follow-up with PCP.  Return precautions given.  Patient voices understanding is agreeable to this plan.  At this stage in the ED course, the patient has been medically screened and is stable for discharge. Final Clinical Impression(s) / ED Diagnoses Final diagnoses:  Acute pain of right knee    Rx / DC Orders ED Discharge Orders         Ordered    ibuprofen (ADVIL) 800 MG tablet  3 times daily     10/08/19 2027           Leone Brand 10/08/19 2134    Linwood Dibbles, MD 10/09/19 [phone]

## 2019-10-08 NOTE — ED Triage Notes (Signed)
C/o R knee pain and swelling x 1 week.  No known injury.

## 2020-05-23 ENCOUNTER — Other Ambulatory Visit: Payer: Self-pay

## 2020-05-23 ENCOUNTER — Other Ambulatory Visit (HOSPITAL_COMMUNITY)
Admission: RE | Admit: 2020-05-23 | Discharge: 2020-05-23 | Disposition: A | Discharge status: Home / Self Care | Payer: No Typology Code available for payment source | Source: Ambulatory Visit | Attending: Nurse Practitioner | Admitting: Nurse Practitioner

## 2020-05-23 ENCOUNTER — Ambulatory Visit (INDEPENDENT_AMBULATORY_CARE_PROVIDER_SITE_OTHER): Payer: No Typology Code available for payment source | Admitting: Nurse Practitioner

## 2020-05-23 ENCOUNTER — Encounter: Payer: Self-pay | Admitting: Nurse Practitioner

## 2020-05-23 VITALS — BP 118/72 | HR 100 | Temp 97.9°F | Ht 64.0 in | Wt 266.2 lb

## 2020-05-23 DIAGNOSIS — Z113 Encounter for screening for infections with a predominantly sexual mode of transmission: Secondary | ICD-10-CM | POA: Diagnosis not present

## 2020-05-23 DIAGNOSIS — L02419 Cutaneous abscess of limb, unspecified: Secondary | ICD-10-CM | POA: Diagnosis not present

## 2020-05-23 DIAGNOSIS — Z3046 Encounter for surveillance of implantable subdermal contraceptive: Secondary | ICD-10-CM

## 2020-05-23 DIAGNOSIS — Z136 Encounter for screening for cardiovascular disorders: Secondary | ICD-10-CM

## 2020-05-23 DIAGNOSIS — Z0001 Encounter for general adult medical examination with abnormal findings: Secondary | ICD-10-CM | POA: Diagnosis not present

## 2020-05-23 DIAGNOSIS — Z13220 Encounter for screening for lipoid disorders: Secondary | ICD-10-CM | POA: Diagnosis not present

## 2020-05-23 DIAGNOSIS — A5901 Trichomonal vulvovaginitis: Secondary | ICD-10-CM

## 2020-05-23 HISTORY — DX: Encounter for screening for infections with a predominantly sexual mode of transmission: Z11.3

## 2020-05-23 LAB — COMPREHENSIVE METABOLIC PANEL
ALT: 24 U/L (ref 0–35)
AST: 24 U/L (ref 0–37)
Albumin: 3.9 g/dL (ref 3.5–5.2)
Alkaline Phosphatase: 65 U/L (ref 39–117)
BUN: 7 mg/dL (ref 6–23)
CO2: 27 mEq/L (ref 19–32)
Calcium: 9.1 mg/dL (ref 8.4–10.5)
Chloride: 103 mEq/L (ref 96–112)
Creatinine, Ser: 0.57 mg/dL (ref 0.40–1.20)
GFR: 128.35 mL/min (ref >60.00)
Glucose, Bld: 86 mg/dL (ref 70–99)
Potassium: 4.3 mEq/L (ref 3.5–5.1)
Sodium: 136 mEq/L (ref 135–145)
Total Bilirubin: 0.5 mg/dL (ref 0.2–1.2)
Total Protein: 7 g/dL (ref 6.0–8.3)

## 2020-05-23 LAB — LIPID PANEL
Cholesterol: 130 mg/dL (ref 0–200)
HDL: 34.2 mg/dL — ABNORMAL LOW (ref >39.00)
LDL Cholesterol: 80 mg/dL (ref 0–99)
NonHDL: 95.89
Total CHOL/HDL Ratio: 4
Triglycerides: 77 mg/dL (ref 0.0–149.0)
VLDL: 15.4 mg/dL (ref 0.0–40.0)

## 2020-05-23 LAB — CBC
HCT: 39.5 % (ref 36.0–46.0)
Hemoglobin: 12.9 g/dL (ref 12.0–15.0)
MCHC: 32.6 g/dL (ref 30.0–36.0)
MCV: 88.1 fl (ref 78.0–100.0)
Platelets: 323 10*3/uL (ref 150.0–400.0)
RBC: 4.48 Mil/uL (ref 3.87–5.11)
RDW: 14.4 % (ref 11.5–15.5)
WBC: 10.2 10*3/uL (ref 4.0–10.5)

## 2020-05-23 LAB — TSH: TSH: 0.5 u[IU]/mL (ref 0.35–4.50)

## 2020-05-23 MED ORDER — SULFAMETHOXAZOLE-TRIMETHOPRIM 800-160 MG PO TABS: 1.0000 | ORAL_TABLET | Freq: Two times a day (BID) | ORAL | 0 refills | Status: DC

## 2020-05-23 NOTE — Progress Notes (Addendum)
Subjective:  Patient ID: Beth Goodwin, female    DOB: 1995/07/02  Age: 25 y.o. MRN: [id number]  CC: Establish Care (New patient/Would like to discuss birth control, current birth control expires 12/06/20 and she needs pcp to continue getting this medication./Declines flu vaccine)  Ms. Munar is here to establish care, address complaints and complete a physical.  Rash This is a recurrent problem. The current episode started more than 1 year ago. The problem has been waxing and waning since onset. The affected locations include the left axilla and right axilla. The rash is characterized by pain, redness, draining and swelling. She was exposed to nothing. Pertinent negatives include no congestion, cough, diarrhea, fatigue, fever, joint pain, shortness of breath or sore throat. Past treatments include nothing.   Encounter for surveillance of implantable subdermal contraceptive Inserted 11/2017 Entered referral to GYN  Health Maintenance reviewed - up to date with PAP (normal 2020), agreed to breat exam and STD screen today. Declined need for pelvic exam today.  Health Maintenance Due  Topic Date Due   CHLAMYDIA SCREENING  02/08/2020   Reviewed past Medical, Social and Family history today.  Outpatient Medications Prior to Visit  Medication Sig Dispense Refill   etonogestrel (NEXPLANON) 68 MG IMPL implant 1 each by Subdermal route once.     ibuprofen (ADVIL) 800 MG tablet Take 1 tablet (800 mg total) by mouth 3 (three) times daily. 21 tablet 0   No facility-administered medications prior to visit.    ROS Review of Systems  Constitutional: Negative for fatigue, fever, malaise/fatigue and weight loss.  HENT: Negative for congestion and sore throat.   Eyes:       Negative for visual changes  Respiratory: Negative for cough and shortness of breath.   Cardiovascular: Negative for chest pain, palpitations and leg swelling.  Gastrointestinal: Negative for blood in stool,  constipation, diarrhea and heartburn.  Genitourinary: Negative for dysuria, frequency and urgency.  Musculoskeletal: Negative for falls, joint pain and myalgias.  Skin: Positive for rash.  Neurological: Negative for dizziness, sensory change and headaches.  Endo/Heme/Allergies: Does not bruise/bleed easily.  Psychiatric/Behavioral: Negative for depression, substance abuse and suicidal ideas. The patient is not nervous/anxious.     Objective:  BP 118/72 (BP Location: Right Arm, Patient Position: Sitting, Cuff Size: Large)    Pulse 100    Temp 97.9 F (36.6 C) (Temporal)    Ht 5\' 4"  (1.626 m)    Wt 266 lb 3.2 oz (120.7 kg)    SpO2 99%    BMI 45.69 kg/m   Physical Exam Vitals and nursing note reviewed.  Constitutional:      General: She is not in acute distress.    Appearance: She is obese.  HENT:     Right Ear: Tympanic membrane, ear canal and external ear normal.     Left Ear: Tympanic membrane, ear canal and external ear normal.     Mouth/Throat:     Mouth: Oropharynx is clear and moist.  Eyes:     General: No scleral icterus.    Extraocular Movements: Extraocular movements intact and EOM normal.     Conjunctiva/sclera: Conjunctivae normal.     Pupils: Pupils are equal, round, and reactive to light.  Neck:     Thyroid: No thyromegaly.  Cardiovascular:     Rate and Rhythm: Normal rate and regular rhythm.     Pulses: Normal pulses and intact distal pulses.     Heart sounds: Normal heart sounds.  Pulmonary:  Effort: Pulmonary effort is normal.     Breath sounds: Normal breath sounds.  Chest:     Chest wall: No mass or tenderness.  Breasts:     Right: Normal. Axillary adenopathy present. No supraclavicular adenopathy.     Left: Normal. Axillary adenopathy present. No supraclavicular adenopathy.    Abdominal:     General: Bowel sounds are normal. There is no distension.     Palpations: Abdomen is soft.     Tenderness: There is no abdominal tenderness.  Genitourinary:     Comments: declined Musculoskeletal:        General: No tenderness or edema. Normal range of motion.     Cervical back: Normal range of motion and neck supple.  Lymphadenopathy:     Cervical: No cervical adenopathy.     Upper Body:     Right upper body: Axillary adenopathy present. No supraclavicular or pectoral adenopathy.     Left upper body: Axillary adenopathy present. No supraclavicular or pectoral adenopathy.  Skin:    General: Skin is warm and dry.     Findings: Erythema and rash present. Rash is pustular.  Neurological:     Mental Status: She is alert and oriented to person, place, and time.  Psychiatric:        Mood and Affect: Mood normal.        Behavior: Behavior normal.        Thought Content: Thought content normal.        Judgment: Judgment normal.    Assessment & Plan:  This visit occurred during the SARS-CoV-2 public health emergency.  Safety protocols were in place, including screening questions prior to the visit, additional usage of staff PPE, and extensive cleaning of exam room while observing appropriate contact time as indicated for disinfecting solutions.   [place] was seen today for establish care.  Diagnoses and all orders for this visit:  Encounter for preventative adult health care exam with abnormal findings -     CBC -     Comprehensive metabolic panel -     Lipid panel  Encounter for lipid screening for cardiovascular disease -     Lipid panel  Morbid obesity (HCC) -     TSH  Screen for STD (sexually transmitted disease) -     HIV antibody (with reflex) -     Urine cytology ancillary only([place]) -     RPR  Encounter for surveillance of implantable subdermal contraceptive -     Ambulatory referral to Gynecology  Axillary abscess -     sulfamethoxazole-trimethoprim (BACTRIM DS) 800-160 MG tablet; Take 1 tablet by mouth 2 (two) times daily.  Trichomoniasis of vagina -     metroNIDAZOLE (FLAGYL) 500 MG tablet; Take 4 tablets (2,000  mg total) by mouth once for 1 dose. -     Urine cytology ancillary only([place]); Future    Problem List Items Addressed This Visit      Other   Encounter for surveillance of implantable subdermal contraceptive    Inserted 11/2017 Entered referral to GYN      Relevant Orders   Ambulatory referral to Gynecology   Morbid obesity (HCC)   Relevant Orders   TSH (Completed)    Other Visit Diagnoses    Encounter for preventative adult health care exam with abnormal findings    -  Primary   Relevant Orders   CBC (Completed)   Comprehensive metabolic panel (Completed)   Lipid panel (Completed)   Encounter for lipid  screening for cardiovascular disease       Relevant Orders   Lipid panel (Completed)   Screen for STD (sexually transmitted disease)       Relevant Orders   HIV antibody (with reflex) (Completed)   Urine cytology ancillary only([place]) (Completed)   RPR (Completed)   Axillary abscess       Relevant Medications   sulfamethoxazole-trimethoprim (BACTRIM DS) 800-160 MG tablet   Trichomoniasis of vagina       Relevant Medications   sulfamethoxazole-trimethoprim (BACTRIM DS) 800-160 MG tablet   metroNIDAZOLE (FLAGYL) 500 MG tablet   Other Relevant Orders   Urine cytology ancillary only([place])      Follow-up: Return in about 1 year (around 05/23/2021) for CPE (fasting).  Alysia Penna, NP

## 2020-05-23 NOTE — Patient Instructions (Signed)
You will be contacted to schedule an appt with GYN  Go to lab for blood draw and urine collection  Continue daily exercise and heart healthy diet  Use warm compress on abscess 2-3x/day, each time.  Hidradenitis Suppurativa Hidradenitis suppurativa is a long-term (chronic) skin disease. It is similar to a severe form of acne, but it affects areas of the body where acne would be unusual, especially areas of the body where skin rubs against skin and becomes moist. These include:  Underarms.  Groin.  Genital area.  Buttocks.  Upper thighs.  Breasts. Hidradenitis suppurativa may start out as small lumps or pimples caused by blocked sweat glands or hair follicles. Pimples may develop into deep sores that break open (rupture) and drain pus. Over time, affected areas of skin may thicken and become scarred. This condition is rare and does not spread from person to person (non-contagious). What are the causes? The exact cause of this condition is not known. It may be related to:  Female and female hormones.  An overactive disease-fighting system (immune system). The immune system may over-react to blocked hair follicles or sweat glands and cause swelling and pus-filled sores. What increases the risk? You are more likely to develop this condition if you:  Are female.  Are 56-80 years old.  Have a family history of hidradenitis suppurativa.  Have a personal history of acne.  Are overweight.  Smoke.  Take the medicine lithium. What are the signs or symptoms? The first symptoms are usually painful bumps in the skin, similar to pimples. The condition may get worse over time (progress), or it may only cause mild symptoms. If the disease progresses, symptoms may include:  Skin bumps getting bigger and growing deeper into the skin.  Bumps rupturing and draining pus.  Itchy, infected skin.  Skin getting thicker and scarred.  Tunnels under the skin (fistulas) where pus drains  from a bump.  Pain during daily activities, such as pain during walking if your groin area is affected.  Emotional problems, such as stress or depression. This condition may affect your appearance and your ability or willingness to wear certain clothes or do certain activities. How is this diagnosed? This condition is diagnosed by a health care provider who specializes in skin diseases (dermatologist). You may be diagnosed based on:  Your symptoms and medical history.  A physical exam.  Testing a pus sample for infection.  Blood tests. How is this treated? Your treatment will depend on how severe your symptoms are. The same treatment will not work for everybody with this condition. You may need to try several treatments to find what works best for you. Treatment may include:  Cleaning and bandaging (dressing) your wounds as needed.  Lifestyle changes, such as new skin care routines.  Taking medicines, such as: ? Antibiotics. ? Acne medicines. ? Medicines to reduce the activity of the immune system. ? A diabetes medicine (metformin). ? Birth control pills, for women. ? Steroids to reduce swelling and pain.  Working with a mental health care provider, if you experience emotional distress due to this condition. If you have severe symptoms that do not get better with medicine, you may need surgery. Surgery may involve:  Using a laser to clear the skin and remove hair follicles.  Opening and draining deep sores.  Removing the areas of skin that are diseased and scarred. Follow these instructions at home: Medicines  Take over-the-counter and prescription medicines only as told by your health care  provider.  If you were prescribed an antibiotic medicine, take it as told by your health care provider. Do not stop taking the antibiotic even if your condition improves.   Skin care  If you have open wounds, cover them with a clean dressing as told by your health care provider. Keep  wounds clean by washing them gently with soap and water when you bathe.  Do not shave the areas where you get hidradenitis suppurativa.  Do not wear deodorant.  Wear loose-fitting clothes.  Try to avoid getting overheated or sweaty. If you get sweaty or wet, change into clean, dry clothes as soon as you can.  To help relieve pain and itchiness, cover sore areas with a warm, clean washcloth (warm compress) for 5-10 minutes as often as needed.  If told by your health care provider, take a bleach bath twice a week: ? Fill your bathtub halfway with water. ? Pour in  cup of unscented household bleach. ? Soak in the tub for 5-10 minutes. ? Only soak from the neck down. Avoid water on your face and hair. ? Shower to rinse off the bleach from your skin. General instructions  Learn as much as you can about your disease so that you have an active role in your treatment. Work closely with your health care provider to find treatments that work for you.  If you are overweight, work with your health care provider to lose weight as recommended.  Do not use any products that contain nicotine or tobacco, such as cigarettes and e-cigarettes. If you need help quitting, ask your health care provider.  If you struggle with living with this condition, talk with your health care provider or work with a mental health care provider as recommended.  Keep all follow-up visits as told by your health care provider. This is important. Where to find more information  Hidradenitis Suppurativa Foundation, Inc.: https://www.hs-foundation.org/  American Academy of Dermatology: Instant[employer] Contact a health care provider if you have:  A flare-up of hidradenitis suppurativa.  A fever or chills.  Trouble controlling your symptoms at home.  Trouble doing your daily activities because of your symptoms.  Trouble dealing with emotional problems related to your condition. Summary  Hidradenitis  suppurativa is a long-term (chronic) skin disease. It is similar to a severe form of acne, but it affects areas of the body where acne would be unusual.  The first symptoms are usually painful bumps in the skin, similar to pimples. The condition may only cause mild symptoms, or it may get worse over time (progress).  If you have open wounds, cover them with a clean dressing as told by your health care provider. Keep wounds clean by washing them gently with soap and water when you bathe.  Besides skin care, treatment may include medicines, laser treatment, and surgery. This information is not intended to replace advice given to you by your health care provider. Make sure you discuss any questions you have with your health care provider. Document Revised: 02/20/2020 Document Reviewed: 02/20/2020 Elsevier Patient Education  2021 Elsevier Inc.   Health Maintenance, Female Adopting a healthy lifestyle and getting preventive care are important in promoting health and wellness. Ask your health care provider about:  The right schedule for you to have regular tests and exams.  Things you can do on your own to prevent diseases and keep yourself healthy. What should I know about diet, weight, and exercise? Eat a healthy diet  Eat a diet that includes plenty  of vegetables, fruits, low-fat dairy products, and lean protein.  Do not eat a lot of foods that are high in solid fats, added sugars, or sodium.   Maintain a healthy weight Body mass index (BMI) is used to identify weight problems. It estimates body fat based on height and weight. Your health care provider can help determine your BMI and help you achieve or maintain a healthy weight. Get regular exercise Get regular exercise. This is one of the most important things you can do for your health. Most adults should:  Exercise for at least 150 minutes each week. The exercise should increase your heart rate and make you sweat (moderate-intensity  exercise).  Do strengthening exercises at least twice a week. This is in addition to the moderate-intensity exercise.  Spend less time sitting. Even light physical activity can be beneficial. Watch cholesterol and blood lipids Have your blood tested for lipids and cholesterol at 24 years of age, then have this test every 5 years. Have your cholesterol levels checked more often if:  Your lipid or cholesterol levels are high.  You are older than 24 years of age.  You are at high risk for heart disease. What should I know about cancer screening? Depending on your health history and family history, you may need to have cancer screening at various ages. This may include screening for:  Breast cancer.  Cervical cancer.  Colorectal cancer.  Skin cancer.  Lung cancer. What should I know about heart disease, diabetes, and high blood pressure? Blood pressure and heart disease  High blood pressure causes heart disease and increases the risk of stroke. This is more likely to develop in people who have high blood pressure readings, are of African descent, or are overweight.  Have your blood pressure checked: ? Every 3-5 years if you are 39-1 years of age. ? Every year if you are 27 years old or older. Diabetes Have regular diabetes screenings. This checks your fasting blood sugar level. Have the screening done:  Once every three years after age 68 if you are at a normal weight and have a low risk for diabetes.  More often and at a younger age if you are overweight or have a high risk for diabetes. What should I know about preventing infection? Hepatitis B If you have a higher risk for hepatitis B, you should be screened for this virus. Talk with your health care provider to find out if you are at risk for hepatitis B infection. Hepatitis C Testing is recommended for:  Everyone born from [birth year] through 1964.  Anyone with known risk factors for hepatitis C. Sexually transmitted  infections (STIs)  Get screened for STIs, including gonorrhea and chlamydia, if: ? You are sexually active and are younger than 24 years of age. ? You are older than 24 years of age and your health care provider tells you that you are at risk for this type of infection. ? Your sexual activity has changed since you were last screened, and you are at increased risk for chlamydia or gonorrhea. Ask your health care provider if you are at risk.  Ask your health care provider about whether you are at high risk for HIV. Your health care provider may recommend a prescription medicine to help prevent HIV infection. If you choose to take medicine to prevent HIV, you should first get tested for HIV. You should then be tested every 3 months for as long as you are taking the medicine. Pregnancy  If  you are about to stop having your period (premenopausal) and you may become pregnant, seek counseling before you get pregnant.  Take 400 to 800 micrograms (mcg) of folic acid every day if you become pregnant.  Ask for birth control (contraception) if you want to prevent pregnancy. Osteoporosis and menopause Osteoporosis is a disease in which the bones lose minerals and strength with aging. This can result in bone fractures. If you are 24 years old or older, or if you are at risk for osteoporosis and fractures, ask your health care provider if you should:  Be screened for bone loss.  Take a calcium or vitamin D supplement to lower your risk of fractures.  Be given hormone replacement therapy (HRT) to treat symptoms of menopause. Follow these instructions at home: Lifestyle  Do not use any products that contain nicotine or tobacco, such as cigarettes, e-cigarettes, and chewing tobacco. If you need help quitting, ask your health care provider.  Do not use street drugs.  Do not share needles.  Ask your health care provider for help if you need support or information about quitting drugs. Alcohol use  Do  not drink alcohol if: ? Your health care provider tells you not to drink. ? You are pregnant, may be pregnant, or are planning to become pregnant.  If you drink alcohol: ? Limit how much you use to 0-1 drink a day. ? Limit intake if you are breastfeeding.  Be aware of how much alcohol is in your drink. In the [place] one drink equals one 12 oz bottle of beer (355 mL), one 5 oz glass of wine (148 mL), or one 1 oz glass of hard liquor (44 mL). General instructions  Schedule regular health, dental, and eye exams.  Stay current with your vaccines.  Tell your health care provider if: ? You often feel depressed. ? You have ever been abused or do not feel safe at home. Summary  Adopting a healthy lifestyle and getting preventive care are important in promoting health and wellness.  Follow your health care provider's instructions about healthy diet, exercising, and getting tested or screened for diseases.  Follow your health care provider's instructions on monitoring your cholesterol and blood pressure. This information is not intended to replace advice given to you by your health care provider. Make sure you discuss any questions you have with your health care provider. Document Revised: 04/20/2018 Document Reviewed: 04/20/2018 Elsevier Patient Education  2021 [employer]

## 2020-05-23 NOTE — Assessment & Plan Note (Signed)
Inserted 11/2017 Entered referral to GYN

## 2020-05-24 LAB — URINE CYTOLOGY ANCILLARY ONLY
Chlamydia: NEGATIVE
Comment: NEGATIVE
Comment: NEGATIVE
Comment: NORMAL
Neisseria Gonorrhea: NEGATIVE
Trichomonas: POSITIVE — AB

## 2020-05-24 LAB — RPR: RPR Ser Ql: NONREACTIVE

## 2020-05-24 LAB — HIV ANTIBODY (ROUTINE TESTING W REFLEX): HIV 1&2 Ab, 4th Generation: NONREACTIVE

## 2020-05-24 MED ORDER — METRONIDAZOLE 500 MG PO TABS: 2000.0000 mg | ORAL_TABLET | Freq: Once | ORAL | 0 refills | Status: AC

## 2020-05-24 NOTE — Addendum Note (Signed)
Addended by: Alysia Penna L on: 05/24/2020 04:00 PM   Modules accepted: Orders

## 2020-06-28 ENCOUNTER — Ambulatory Visit: Payer: No Typology Code available for payment source | Admitting: Obstetrics and Gynecology

## 2020-09-15 ENCOUNTER — Other Ambulatory Visit: Payer: Self-pay

## 2020-09-15 ENCOUNTER — Emergency Department (HOSPITAL_COMMUNITY)
Admission: EM | Admit: 2020-09-15 | Discharge: 2020-09-15 | Disposition: A | Discharge status: Home / Self Care | Payer: Medicaid Other | Attending: Emergency Medicine | Admitting: Emergency Medicine

## 2020-09-15 ENCOUNTER — Emergency Department (HOSPITAL_COMMUNITY): Payer: Medicaid Other

## 2020-09-15 ENCOUNTER — Encounter (HOSPITAL_COMMUNITY): Payer: Self-pay | Admitting: Emergency Medicine

## 2020-09-15 DIAGNOSIS — U071 COVID-19: Secondary | ICD-10-CM | POA: Insufficient documentation

## 2020-09-15 DIAGNOSIS — R509 Fever, unspecified: Secondary | ICD-10-CM | POA: Diagnosis not present

## 2020-09-15 DIAGNOSIS — Z28310 Unvaccinated for covid-19: Secondary | ICD-10-CM | POA: Diagnosis not present

## 2020-09-15 DIAGNOSIS — R000 Tachycardia, unspecified: Secondary | ICD-10-CM | POA: Diagnosis not present

## 2020-09-15 DIAGNOSIS — K029 Dental caries, unspecified: Secondary | ICD-10-CM | POA: Insufficient documentation

## 2020-09-15 LAB — I-STAT BETA HCG BLOOD, ED (MC, WL, AP ONLY): I-stat hCG, quantitative: <5 m[IU]/mL (ref <5)

## 2020-09-15 LAB — URINALYSIS, ROUTINE W REFLEX MICROSCOPIC
Bilirubin Urine: NEGATIVE
Glucose, UA: NEGATIVE mg/dL
Hgb urine dipstick: NEGATIVE
Ketones, ur: NEGATIVE mg/dL
Leukocytes,Ua: NEGATIVE
Nitrite: NEGATIVE
Protein, ur: NEGATIVE mg/dL
Specific Gravity, Urine: 1.018 (ref 1.005–1.030)
pH: 5 (ref 5.0–8.0)

## 2020-09-15 LAB — COMPREHENSIVE METABOLIC PANEL
ALT: 62 U/L — ABNORMAL HIGH (ref 0–44)
AST: 75 U/L — ABNORMAL HIGH (ref 15–41)
Albumin: 3.8 g/dL (ref 3.5–5.0)
Alkaline Phosphatase: 56 U/L (ref 38–126)
Anion gap: 9 (ref 5–15)
BUN: 6 mg/dL (ref 6–20)
CO2: 24 mmol/L (ref 22–32)
Calcium: 9.2 mg/dL (ref 8.9–10.3)
Chloride: 101 mmol/L (ref 98–111)
Creatinine, Ser: 0.77 mg/dL (ref 0.44–1.00)
GFR, Estimated: >60 mL/min (ref >60)
Glucose, Bld: 127 mg/dL — ABNORMAL HIGH (ref 70–99)
Potassium: 3.9 mmol/L (ref 3.5–5.1)
Sodium: 134 mmol/L — ABNORMAL LOW (ref 135–145)
Total Bilirubin: 0.9 mg/dL (ref 0.3–1.2)
Total Protein: 8 g/dL (ref 6.5–8.1)

## 2020-09-15 LAB — CBC
HCT: 45.7 % (ref 36.0–46.0)
Hemoglobin: 14.6 g/dL (ref 12.0–15.0)
MCH: 28.5 pg (ref 26.0–34.0)
MCHC: 31.9 g/dL (ref 30.0–36.0)
MCV: 89.3 fL (ref 80.0–100.0)
Platelets: 301 10*3/uL (ref 150–400)
RBC: 5.12 MIL/uL — ABNORMAL HIGH (ref 3.87–5.11)
RDW: 12.9 % (ref 11.5–15.5)
WBC: 7.8 10*3/uL (ref 4.0–10.5)
nRBC: 0 % (ref 0.0–0.2)

## 2020-09-15 LAB — RESP PANEL BY RT-PCR (FLU A&B, COVID) ARPGX2
Influenza A by PCR: NEGATIVE
Influenza B by PCR: NEGATIVE
SARS Coronavirus 2 by RT PCR: POSITIVE — AB

## 2020-09-15 LAB — CBG MONITORING, ED: Glucose-Capillary: 132 mg/dL — ABNORMAL HIGH (ref 70–99)

## 2020-09-15 LAB — LIPASE, BLOOD: Lipase: 21 U/L (ref 11–51)

## 2020-09-15 MED ORDER — SODIUM CHLORIDE 0.9% FLUSH: 3.0000 mL | Freq: Once | INTRAVENOUS | Status: DC

## 2020-09-15 MED ORDER — ACETAMINOPHEN 160 MG/5ML PO SOLN
650.0000 mg | Freq: Once | ORAL | Status: AC
  Administered 2020-09-15: 650 mg via ORAL
  Filled 2020-09-15: qty 20.3

## 2020-09-15 MED ORDER — NIRMATRELVIR/RITONAVIR (PAXLOVID)TABLET: 2.0000 | ORAL_TABLET | Freq: Two times a day (BID) | ORAL | 0 refills | Status: AC

## 2020-09-15 MED ORDER — KETOROLAC TROMETHAMINE 30 MG/ML IJ SOLN
30.0000 mg | Freq: Once | INTRAMUSCULAR | Status: AC
  Administered 2020-09-15: 30 mg via INTRAMUSCULAR
  Filled 2020-09-15: qty 1

## 2020-09-15 MED ORDER — NIRMATRELVIR/RITONAVIR (PAXLOVID)TABLET: 2.0000 | ORAL_TABLET | Freq: Two times a day (BID) | ORAL | 0 refills | Status: DC

## 2020-09-15 MED ORDER — CLINDAMYCIN HCL 150 MG PO CAPS: 300.0000 mg | ORAL_CAPSULE | Freq: Three times a day (TID) | ORAL | 0 refills | Status: DC

## 2020-09-15 NOTE — ED Triage Notes (Addendum)
Pt reports fever up to 105, lightheadedness, and headache since this morning.  Also reports L sided "mouth" pain.  Questioned if it is dental pain and she states more the L side of face.

## 2020-09-15 NOTE — ED Provider Notes (Signed)
Emergency Medicine Provider Triage Evaluation Note  Beth Goodwin , a 25 y.o. female  was evaluated in triage.  Pt complains of fever x 1 day. t-max 105 this AM. Also left sided dental pain and throbbing headache, cough. No covid exposures. Last dose of tylenol at 3pm, no motrin today.  Review of Systems  Positive: Fever, dental pain, cough Negative: Neck pain or stiffness, cough, urinary symptoms  Physical Exam  BP 123/72 (BP Location: Right Arm)   Pulse (!) 120   Temp (!) 100.7 F (38.2 C) (Oral)   Resp 18   SpO2 97%  Gen:   Awake, no distress  Resp:  Normal effort  MSK:   Moves extremities without difficulty  Other:  Mild epigastric tenderness without peritoneal signs  Medical Decision Making  Medically screening exam initiated at 6:43 PM.  Appropriate orders placed.  Beth Goodwin was informed that the remainder of the evaluation will be completed by another provider, this initial triage assessment does not replace that evaluation, and the importance of remaining in the ED until their evaluation is complete.  Labs, chest xray and covid swab ordered.   Shanon Ace, PA-C 09/15/20 Jonni Sanger, MD 09/15/20 Windell Moment

## 2020-09-15 NOTE — ED Provider Notes (Signed)
[facility] EMERGENCY DEPARTMENT Provider Note   CSN: [id number] Arrival date & time: 09/15/20  1741     History Chief Complaint  Patient presents with  . Fever  . Headache    Beth Goodwin is a 25 y.o. female.  The history is provided by the patient.   Beth Goodwin is a 25 y.o. female who presents to the Emergency Department complaining of fever, headache and body aches. She presents the emergency department for evaluation of fever that started today, temperature up to 105. She has global headache, body aches, mild cough. She does have left-sided dental pain. She took Tylenol earlier today with improvement in her symptoms. She is not been vaccinated for COVID-19. No known sick contacts. She has mild abdominal discomfort. No vomiting or diarrhea.    Past Medical History:  Diagnosis Date  . Allergy   . GERD (gastroesophageal reflux disease)     Patient Active Problem List   Diagnosis Date Noted  . Encounter for surveillance of implantable subdermal contraceptive 05/23/2020  . Nexplanon in place, replaced 12/03/17 09/06/2017  . Morbid obesity (HCC) 09/06/2017    Past Surgical History:  Procedure Laterality Date  . CESAREAN SECTION N/A 09/10/2014   Procedure: CESAREAN SECTION;  Surgeon: Catalina Antigua, MD;  Location: [facility];  Service: Obstetrics;  Laterality: N/A;  . CHOLECYSTECTOMY N/A 11/02/2014   Procedure: LAPAROSCOPIC CHOLECYSTECTOMY WITH INTRAOPERATIVE CHOLANGIOGRAM;  Surgeon: Chevis Pretty III, MD;  Location: [facility] OR;  Service: General;  Laterality: N/A;     OB History    Gravida  2   Para  1   Term  1   Preterm      AB  1   Living  0     SAB  1   IAB      Ectopic      Multiple  0   Live Births  1           Family History  Problem Relation Age of Onset  . Hypertension Father   . Diabetes Father   . Hypertension Mother   . Diabetes Maternal Aunt   . Diabetes Maternal Grandmother   . Diabetes Paternal Grandmother      Social History   Tobacco Use  . Smoking status: Never Smoker  . Smokeless tobacco: Never Used  Vaping Use  . Vaping Use: Never used  Substance Use Topics  . Alcohol use: No  . Drug use: Yes    Types: Marijuana    Comment: odaily    Home Medications Prior to Admission medications   Medication Sig Start Date End Date Taking? Authorizing Provider  etonogestrel (NEXPLANON) 68 MG IMPL implant 1 each by Subdermal route once.    [provider]  ibuprofen (ADVIL) 800 MG tablet Take 1 tablet (800 mg total) by mouth 3 (three) times daily. 10/08/19   Mare Ferrari, PA-C  nirmatrelvir/ritonavir EUA (PAXLOVID) TABS Take 2 tablets by mouth 2 (two) times daily for 5 days. Patient GFR is >60 Take nirmatrelvir (150 mg) 2 tablet(s) twice daily for 5 days and ritonavir (100 mg) one tablet twice daily for 5 days. 09/15/20 09/20/20  Tilden Fossa, MD  sulfamethoxazole-trimethoprim (BACTRIM DS) 800-160 MG tablet Take 1 tablet by mouth 2 (two) times daily. 05/23/20   Nche, Bonna Gains, NP  dicyclomine (BENTYL) 20 MG tablet Take 1 tablet (20 mg total) by mouth 2 (two) times daily. Patient not taking: Reported on 11/28/2016 05/23/16 04/14/19  Marlon Pel, PA-C  Allergies    Amoxicillin  Review of Systems   Review of Systems  All other systems reviewed and are negative.   Physical Exam Updated Vital Signs BP 117/84   Pulse (!) 107   Temp (!) 100.7 F (38.2 C) (Oral)   Resp 18   SpO2 95%   Physical Exam Vitals and nursing note reviewed.  Constitutional:      Appearance: She is well-developed.  HENT:     Head: Normocephalic and atraumatic.     Comments: No erythema in the posterior oropharynx. There are scattered dental caries Cardiovascular:     Rate and Rhythm: Normal rate and regular rhythm.     Heart sounds: No murmur heard.   Pulmonary:     Effort: Pulmonary effort is normal. No respiratory distress.     Breath sounds: Normal breath sounds.  Abdominal:      Palpations: Abdomen is soft.     Tenderness: There is no abdominal tenderness. There is no guarding or rebound.  Musculoskeletal:        General: No tenderness.     Cervical back: Neck supple.  Skin:    General: Skin is warm and dry.  Neurological:     Mental Status: She is alert and oriented to person, place, and time.  Psychiatric:        Behavior: Behavior normal.     ED Results / Procedures / Treatments   Labs (all labs ordered are listed, but only abnormal results are displayed) Labs Reviewed  RESP PANEL BY RT-PCR (FLU A&B, COVID) ARPGX2 - Abnormal; Notable for the following components:      Result Value   SARS Coronavirus 2 by RT PCR POSITIVE (*)    All other components within normal limits  CBC - Abnormal; Notable for the following components:   RBC 5.12 (*)    All other components within normal limits  URINALYSIS, ROUTINE W REFLEX MICROSCOPIC - Abnormal; Notable for the following components:   APPearance HAZY (*)    All other components within normal limits  COMPREHENSIVE METABOLIC PANEL - Abnormal; Notable for the following components:   Sodium 134 (*)    Glucose, Bld 127 (*)    AST 75 (*)    ALT 62 (*)    All other components within normal limits  CBG MONITORING, ED - Abnormal; Notable for the following components:   Glucose-Capillary 132 (*)    All other components within normal limits  LIPASE, BLOOD  I-STAT BETA HCG BLOOD, ED ([facility], [facility], [facility] ONLY)    EKG EKG Interpretation  Date/Time:  Sunday Sep 15 2020 18:28:38 EDT Ventricular Rate:  116 PR Interval:  124 QRS Duration: 70 QT Interval:  312 QTC Calculation: 433 R Axis:   30 Text Interpretation: Sinus tachycardia Cannot rule out Anterior infarct , age undetermined Abnormal ECG Since last tracing rate faster Otherwise no significant change Confirmed by Mancel Bale [phone]) on 09/15/2020 7:08:26 PM   Radiology DG Chest Portable 1 View  Result Date: 09/15/2020 CLINICAL DATA:  Fevers EXAM: PORTABLE CHEST 1  VIEW COMPARISON:  02/05/2016 FINDINGS: The heart size and mediastinal contours are within normal limits. Both lungs are clear. The visualized skeletal structures are unremarkable. IMPRESSION: No active disease. Electronically Signed   By: Alcide Clever M.D.   On: 09/15/2020 20:25    Procedures Procedures   Medications Ordered in ED Medications  sodium chloride flush (NS) 0.9 % injection 3 mL (has no administration in time range)  ketorolac (TORADOL) 30 MG/ML  injection 30 mg (30 mg Intramuscular Given 09/15/20 2137)  acetaminophen (TYLENOL) 160 MG/5ML solution 650 mg (650 mg Oral Given 09/15/20 2136)    ED Course  I have reviewed the triage vital signs and the nursing notes.  Pertinent labs & imaging results that were available during my care of the patient were reviewed by me and considered in my medical decision making (see chart for details).    MDM Rules/Calculators/A&P                         Patient here for evaluation of fever, headache, facial pain. She is non-toxic appearing on evaluation. She is positive for COVID-19. She was treated with Toradol for her headache did feel improved after this. Given her obesity she is high risk for COVID-19 complications, recommend Paxlovid to prevent progression of illness. Given her dental pain will treat with antibiotics for possible early dental infection. Discussed home care for COVID-19 as well as outpatient follow-up and return precautions.  Presentation is not consistent with sepsis, meningitis  Final Clinical Impression(s) / ED Diagnoses Final diagnoses:  COVID-19 virus infection    Rx / DC Orders ED Discharge Orders         Ordered    nirmatrelvir/ritonavir EUA (PAXLOVID) TABS  2 times daily,   Status:  Discontinued        09/15/20 2301    nirmatrelvir/ritonavir EUA (PAXLOVID) TABS  2 times daily        09/15/20 2302           Tilden Fossa, MD 09/15/20 2324

## 2020-11-21 ENCOUNTER — Ambulatory Visit (INDEPENDENT_AMBULATORY_CARE_PROVIDER_SITE_OTHER): Payer: Medicaid Other | Admitting: Obstetrics & Gynecology

## 2020-11-21 ENCOUNTER — Other Ambulatory Visit: Payer: Self-pay

## 2020-11-21 VITALS — BP 123/74 | Ht 64.0 in | Wt 273.4 lb

## 2020-11-21 DIAGNOSIS — Z3046 Encounter for surveillance of implantable subdermal contraceptive: Secondary | ICD-10-CM | POA: Diagnosis not present

## 2020-11-21 DIAGNOSIS — Z30017 Encounter for initial prescription of implantable subdermal contraceptive: Secondary | ICD-10-CM

## 2020-11-21 MED ORDER — ETONOGESTREL 68 MG ~~LOC~~ IMPL
68.0000 mg | DRUG_IMPLANT | Freq: Once | SUBCUTANEOUS | Status: AC
  Administered 2020-11-21: 68 mg via SUBCUTANEOUS

## 2020-11-21 NOTE — Patient Instructions (Signed)
Nexplanon Instructions After Insertion  Keep bandage clean and dry for 24 hours  May use ice/Tylenol/Ibuprofen for soreness or pain  If you develop fever, drainage or increased warmth from incision site-contact office immediately   

## 2020-11-21 NOTE — Progress Notes (Signed)
     GYNECOLOGY OFFICE PROCEDURE NOTE  Beth Goodwin is a 25 y.o. G2P1010 here for Nexplanon removal and reinsertion.  Last pap smear was on 02/08/2019 and was normal.  No other gynecologic concerns.  Nexplanon Removal and Reinsertion Patient identified, informed consent performed, consent signed.   Patient does understand that irregular bleeding is a very common side effect of this medication. She was advised to have backup contraception for one week after replacement of the implant. Pregnancy test in clinic today was negative.  Appropriate time out taken. Nexplanon site identified in left arm.  Area prepped in usual sterile fashon. One ml of 1% lidocaine was used to anesthetize the area at the distal end of the implant. A small stab incision was made right beside the implant on the distal portion. The Nexplanon rod was grasped using hemostats and removed without difficulty. There was minimal blood loss. There were no complications. Area was then injected with 3 ml of 1 % lidocaine. She was re-prepped with betadine, Nexplanon removed from packaging, Device confirmed in needle, then inserted full length of needle and withdrawn per handbook instructions. Nexplanon was able to palpated in the patient's arm; patient palpated the insert herself.  There was minimal blood loss. Patient insertion site covered with gauze and a pressure bandage to reduce any bruising. The patient tolerated the procedure well and was given post procedure instructions.  She was advised to have backup contraception for one week.     Return in about 14 months (around 01/22/2022) for Annual exam and pap smear.   Jaynie Collins, MD, FACOG Obstetrician & Gynecologist, [facility] for [employer], [facility]

## 2020-12-18 ENCOUNTER — Other Ambulatory Visit: Payer: Self-pay

## 2020-12-18 ENCOUNTER — Ambulatory Visit (HOSPITAL_COMMUNITY)
Admission: EM | Admit: 2020-12-18 | Discharge: 2020-12-18 | Disposition: A | Discharge status: Home / Self Care | Payer: Medicaid Other | Attending: Emergency Medicine | Admitting: Emergency Medicine

## 2020-12-18 ENCOUNTER — Encounter (HOSPITAL_COMMUNITY): Payer: Self-pay

## 2020-12-18 DIAGNOSIS — Z20822 Contact with and (suspected) exposure to covid-19: Secondary | ICD-10-CM | POA: Insufficient documentation

## 2020-12-18 DIAGNOSIS — J069 Acute upper respiratory infection, unspecified: Secondary | ICD-10-CM | POA: Diagnosis not present

## 2020-12-18 NOTE — ED Triage Notes (Signed)
Pt in with c/o covid exposure. States she has been coughing, having congestion and headaches for 2 days  Pt has not had otc meds for sx

## 2020-12-18 NOTE — Discharge Instructions (Addendum)

## 2020-12-18 NOTE — ED Provider Notes (Signed)
[facility]    CSN: [id number] Arrival date & time: 12/18/20  1519      History   Chief Complaint Chief Complaint  Patient presents with   Cough   Headache   Nasal Congestion    HPI Beth Goodwin is a 25 y.o. female.   Patient here for evaluation of cough, congestion, and fatigue that has been ongoing for the past 2 days.  Reports two classmates recently tested positive for COVID.  Has not taken any OTC medication or treatment.  Denies any trauma, injury, or other precipitating event.  Denies any specific alleviating or aggravating factors.  Denies any fevers, chest pain, shortness of breath, N/V/D, numbness, tingling, weakness, abdominal pain, or headaches.    The history is provided by the patient.  Cough Associated symptoms: headaches   Headache Associated symptoms: congestion, cough and fatigue    Past Medical History:  Diagnosis Date   Allergy    GERD (gastroesophageal reflux disease)     Patient Active Problem List   Diagnosis Date Noted   Encounter for surveillance of implantable subdermal contraceptive 05/23/2020   Nexplanon in place, replaced 12/03/17 09/06/2017   Morbid obesity (HCC) 09/06/2017    Past Surgical History:  Procedure Laterality Date   CESAREAN SECTION N/A 09/10/2014   Procedure: CESAREAN SECTION;  Surgeon: Catalina Antigua, MD;  Location: [facility];  Service: Obstetrics;  Laterality: N/A;   CHOLECYSTECTOMY N/A 11/02/2014   Procedure: LAPAROSCOPIC CHOLECYSTECTOMY WITH INTRAOPERATIVE CHOLANGIOGRAM;  Surgeon: Chevis Pretty III, MD;  Location: [facility];  Service: General;  Laterality: N/A;    OB History     Gravida  2   Para  1   Term  1   Preterm      AB  1   Living  0      SAB  1   IAB      Ectopic      Multiple  0   Live Births  1            Home Medications    Prior to Admission medications   Medication Sig Start Date End Date Taking? Authorizing Provider  clindamycin (CLEOCIN) 150 MG capsule Take 2  capsules (300 mg total) by mouth 3 (three) times daily. Patient not taking: Reported on 11/21/2020 09/15/20   Tilden Fossa, MD  ibuprofen (ADVIL) 800 MG tablet Take 1 tablet (800 mg total) by mouth 3 (three) times daily. 10/08/19   Mare Ferrari, PA-C  sulfamethoxazole-trimethoprim (BACTRIM DS) 800-160 MG tablet Take 1 tablet by mouth 2 (two) times daily. Patient not taking: Reported on 11/21/2020 05/23/20   Nche, Bonna Gains, NP  dicyclomine (BENTYL) 20 MG tablet Take 1 tablet (20 mg total) by mouth 2 (two) times daily. Patient not taking: Reported on 11/28/2016 05/23/16 04/14/19  Marlon Pel, PA-C    Family History Family History  Problem Relation Age of Onset   Hypertension Father    Diabetes Father    Hypertension Mother    Diabetes Maternal Aunt    Diabetes Maternal Grandmother    Diabetes Paternal Grandmother     Social History Social History   Tobacco Use   Smoking status: Never   Smokeless tobacco: Never  Vaping Use   Vaping Use: Never used  Substance Use Topics   Alcohol use: No   Drug use: Yes    Types: Marijuana    Comment: odaily     Allergies   Amoxicillin   Review of Systems Review of  Systems  Constitutional:  Positive for fatigue.  HENT:  Positive for congestion.   Respiratory:  Positive for cough.   Neurological:  Positive for headaches.  All other systems reviewed and are negative.   Physical Exam Triage Vital Signs ED Triage Vitals  Enc Vitals Group     BP 12/18/20 1643 132/88     Pulse Rate 12/18/20 1643 93     Resp 12/18/20 1643 20     Temp 12/18/20 1643 98.8 F (37.1 C)     Temp Source 12/18/20 1643 Oral     SpO2 12/18/20 1643 100 %     Weight --      Height --      Head Circumference --      Peak Flow --      Pain Score 12/18/20 1642 5     Pain Loc --      Pain Edu? --      Excl. in GC? --    No data found.  Updated Vital Signs BP 132/88 (BP Location: Right Arm)   Pulse 93   Temp 98.8 F (37.1 C) (Oral)   Resp 20    LMP 11/08/2020 (Exact Date)   SpO2 100%   Visual Acuity Right Eye Distance:   Left Eye Distance:   Bilateral Distance:    Right Eye Near:   Left Eye Near:    Bilateral Near:     Physical Exam Vitals and nursing note reviewed.  Constitutional:      General: She is not in acute distress.    Appearance: Normal appearance. She is not ill-appearing, toxic-appearing or diaphoretic.  HENT:     Head: Normocephalic and atraumatic.  Eyes:     Conjunctiva/sclera: Conjunctivae normal.  Cardiovascular:     Rate and Rhythm: Normal rate and regular rhythm.     Pulses: Normal pulses.     Heart sounds: Normal heart sounds.  Pulmonary:     Effort: Pulmonary effort is normal.     Breath sounds: Normal breath sounds.  Abdominal:     General: Abdomen is flat.  Musculoskeletal:        General: Normal range of motion.     Cervical back: Normal range of motion.  Skin:    General: Skin is warm and dry.  Neurological:     General: No focal deficit present.     Mental Status: She is alert and oriented to person, place, and time.  Psychiatric:        Mood and Affect: Mood normal.     UC Treatments / Results  Labs (all labs ordered are listed, but only abnormal results are displayed) Labs Reviewed  SARS CORONAVIRUS 2 (TAT 6-24 HRS)    EKG   Radiology No results found.  Procedures Procedures (including critical care time)  Medications Ordered in UC Medications - No data to display  Initial Impression / Assessment and Plan / UC Course  I have reviewed the triage vital signs and the nursing notes.  Pertinent labs & imaging results that were available during my care of the patient were reviewed by me and considered in my medical decision making (see chart for details).    Assessment negative for red flags and concerns.  COVID test pending.  Discussed need to quarantine while waiting for results and then for at least 5 days from symptom onset if test is positive.  Encouraged fluids  and rest.  Discussed conservative symptom management as described.  Follow up as needed.  Final Clinical Impressions(s) / UC Diagnoses   Final diagnoses:  Viral URI     Discharge Instructions      We will contact you if your COVID test is positive.  Please quarantine while you wait for the results.  If your test is negative you may resume normal activities.  If your test is positive please continue to quarantine for at least 5 days from your symptom onset or until you are without a fever for at least 24 hours after the medications.  You can take Tylenol and/or Ibuprofen as needed for fever reduction and pain relief.   For cough: honey 1/2 to 1 teaspoon (you can dilute the honey in water or another fluid).  You can also use guaifenesin and dextromethorphan for cough. You can use a humidifier for chest congestion and cough.  If you don't have a humidifier, you can sit in the bathroom with the hot shower running.     For sore throat: try warm salt water gargles, cepacol lozenges, throat spray, warm tea or water with lemon/honey, popsicles or ice, or OTC cold relief medicine for throat discomfort.    For congestion: take a daily anti-histamine like Zyrtec, Claritin, and a oral decongestant, such as pseudoephedrine.  You can also use Flonase 1-2 sprays in each nostril daily.    It is important to stay hydrated: drink plenty of fluids (water, gatorade/powerade/pedialyte, juices, or teas) to keep your throat moisturized and help further relieve irritation/discomfort.   Return or go to the Emergency Department if symptoms worsen or do not improve in the next few days.      ED Prescriptions   None    PDMP not reviewed this encounter.   Ivette Loyal, NP 12/18/20 1734

## 2020-12-19 LAB — SARS CORONAVIRUS 2 (TAT 6-24 HRS): SARS Coronavirus 2: NEGATIVE

## 2021-04-10 DIAGNOSIS — Z419 Encounter for procedure for purposes other than remedying health state, unspecified: Secondary | ICD-10-CM | POA: Diagnosis not present

## 2021-05-11 DIAGNOSIS — Z419 Encounter for procedure for purposes other than remedying health state, unspecified: Secondary | ICD-10-CM | POA: Diagnosis not present

## 2021-05-26 ENCOUNTER — Other Ambulatory Visit: Payer: Self-pay

## 2021-05-27 ENCOUNTER — Encounter: Payer: Medicaid Other | Admitting: Nurse Practitioner

## 2021-05-27 ENCOUNTER — Telehealth: Payer: Self-pay | Admitting: Nurse Practitioner

## 2021-05-27 NOTE — Telephone Encounter (Signed)
Pt no showed.

## 2021-06-11 DIAGNOSIS — Z419 Encounter for procedure for purposes other than remedying health state, unspecified: Secondary | ICD-10-CM | POA: Diagnosis not present

## 2021-07-06 ENCOUNTER — Emergency Department (HOSPITAL_COMMUNITY): Payer: Medicaid Other

## 2021-07-06 ENCOUNTER — Emergency Department (HOSPITAL_COMMUNITY)
Admission: EM | Admit: 2021-07-06 | Discharge: 2021-07-06 | Disposition: A | Discharge status: Home / Self Care | Payer: Medicaid Other | Attending: Emergency Medicine | Admitting: Emergency Medicine

## 2021-07-06 DIAGNOSIS — Z043 Encounter for examination and observation following other accident: Secondary | ICD-10-CM | POA: Diagnosis not present

## 2021-07-06 DIAGNOSIS — M25561 Pain in right knee: Secondary | ICD-10-CM | POA: Insufficient documentation

## 2021-07-06 DIAGNOSIS — W010XXA Fall on same level from slipping, tripping and stumbling without subsequent striking against object, initial encounter: Secondary | ICD-10-CM | POA: Insufficient documentation

## 2021-07-06 DIAGNOSIS — M25461 Effusion, right knee: Secondary | ICD-10-CM | POA: Diagnosis not present

## 2021-07-06 DIAGNOSIS — Y990 Civilian activity done for income or pay: Secondary | ICD-10-CM | POA: Insufficient documentation

## 2021-07-06 NOTE — ED Triage Notes (Signed)
Pt w mechanical fall at work earlier this evening, tylenol for pain approx 2130, continued R knee pain.

## 2021-07-06 NOTE — ED Provider Notes (Signed)
[facility] EMERGENCY DEPARTMENT Provider Note   CSN: [id number] Arrival date & time: 07/06/21  0174     History  Chief Complaint  Patient presents with   Fall   Knee Pain    R    Beth Goodwin is a 26 y.o. female.  HPI Patient is a 26 year old female who presents to the emergency department due to a mechanical fall that occurred earlier this evening at work.  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  Reports continued pain in the right knee.  Denies any numbness or weakness.  Denies any other regions of pain.    Home Medications Prior to Admission medications   Medication Sig Start Date End Date Taking? Authorizing Provider  clindamycin (CLEOCIN) 150 MG capsule Take 2 capsules (300 mg total) by mouth 3 (three) times daily. Patient not taking: Reported on 11/21/2020 09/15/20   Tilden Fossa, MD  ibuprofen (ADVIL) 800 MG tablet Take 1 tablet (800 mg total) by mouth 3 (three) times daily. 10/08/19   Mare Ferrari, PA-C  sulfamethoxazole-trimethoprim (BACTRIM DS) 800-160 MG tablet Take 1 tablet by mouth 2 (two) times daily. Patient not taking: Reported on 11/21/2020 05/23/20   Nche, Bonna Gains, NP  dicyclomine (BENTYL) 20 MG tablet Take 1 tablet (20 mg total) by mouth 2 (two) times daily. Patient not taking: Reported on 11/28/2016 05/23/16 04/14/19  Marlon Pel, PA-C      Allergies    Amoxicillin    Review of Systems   Review of Systems  Musculoskeletal:  Positive for arthralgias and myalgias.  Neurological:  Negative for weakness and numbness.   Physical Exam Updated Vital Signs BP 128/82 (BP Location: Right Arm)    Pulse 85    Temp 98.7 F (37.1 C) (Oral)    Resp 19    SpO2 100%  Physical Exam Vitals and nursing note reviewed.  Constitutional:      General: She is not in acute distress.    Appearance: She is well-developed.  HENT:     Head: Normocephalic and atraumatic.     Right Ear:  External ear normal.     Left Ear: External ear normal.  Eyes:     General: No scleral icterus.       Right eye: No discharge.        Left eye: No discharge.     Conjunctiva/sclera: Conjunctivae normal.  Neck:     Trachea: No tracheal deviation.  Cardiovascular:     Rate and Rhythm: Normal rate.  Pulmonary:     Effort: Pulmonary effort is normal. No respiratory distress.     Breath sounds: No stridor.  Abdominal:     General: There is no distension.  Musculoskeletal:        General: Tenderness present. No swelling or deformity.     Cervical back: Neck supple.     Comments: Mild TTP noted overlying the right patella.  Full range of motion of the right ankle, knee, and hip.  2+ DP pulses.  Distal sensation intact.  Skin:    General: Skin is warm and dry.     Findings: No rash.  Neurological:     Mental Status: She is alert.     Cranial Nerves: Cranial nerve deficit: no gross deficits.    ED Results / Procedures / Treatments   Labs (all labs ordered are listed, but only abnormal results are displayed) Labs Reviewed - No data to  display  EKG None  Radiology DG Knee Complete 4 Views Right  Result Date: 07/06/2021 CLINICAL DATA:  Fall EXAM: RIGHT KNEE - COMPLETE 4+ VIEW COMPARISON:  10/08/2019 FINDINGS: No acute fracture or dislocation. Small joint effusion. The joint spaces are preserved no evidence of arthropathy or other focal bone abnormality. Soft tissues are unremarkable. IMPRESSION: No acute fracture or dislocation. Electronically Signed   By: Wiliam Ke M.D.   On: 07/06/2021 03:23    Procedures Procedures   Medications Ordered in ED Medications - No data to display  ED Course/ Medical Decision Making/ A&P                           Medical Decision Making Amount and/or Complexity of Data Reviewed Radiology: ordered.  Patient is a 26 year old female who presents to the emergency department due to a fall that occurred prior to arrival in which she landed on her  right knee.  On my exam patient has moderate tenderness to the patellar region of the right knee.  Full range of motion of the right ankle, knee, and hip.  Neurovascularly intact distal to the knee.  Patient given a knee sleeve for comfort.  Discussed RICE method.  Recommended continued use of Tylenol for management of her pain.  Feel that she is stable for discharge at this time and she is agreeable.  Patient given a work note.  Her questions were answered and she was amicable at the time of discharge. Final Clinical Impression(s) / ED Diagnoses Final diagnoses:  Acute pain of right knee   Rx / DC Orders ED Discharge Orders     None         [name], PA-C 07/06/21 0418    Marily Memos, MD 07/06/21 [phone]

## 2021-07-06 NOTE — Discharge Instructions (Signed)
Please continue to monitor your symptoms closely.  If you develop any new or worsening symptoms please come back to the emergency department. 

## 2021-07-09 DIAGNOSIS — Z419 Encounter for procedure for purposes other than remedying health state, unspecified: Secondary | ICD-10-CM | POA: Diagnosis not present

## 2021-07-14 NOTE — Telephone Encounter (Signed)
Fee waived, letter sent, first no show ?

## 2021-08-09 DIAGNOSIS — Z419 Encounter for procedure for purposes other than remedying health state, unspecified: Secondary | ICD-10-CM | POA: Diagnosis not present

## 2021-09-08 DIAGNOSIS — Z419 Encounter for procedure for purposes other than remedying health state, unspecified: Secondary | ICD-10-CM | POA: Diagnosis not present

## 2021-09-29 ENCOUNTER — Encounter: Payer: Self-pay | Admitting: Radiology

## 2021-09-29 ENCOUNTER — Encounter: Payer: Medicaid Other | Admitting: Radiology

## 2021-10-09 DIAGNOSIS — Z419 Encounter for procedure for purposes other than remedying health state, unspecified: Secondary | ICD-10-CM | POA: Diagnosis not present

## 2021-11-08 DIAGNOSIS — Z419 Encounter for procedure for purposes other than remedying health state, unspecified: Secondary | ICD-10-CM | POA: Diagnosis not present

## 2021-11-12 ENCOUNTER — Encounter: Payer: Medicaid Other | Admitting: Obstetrics and Gynecology

## 2021-12-09 DIAGNOSIS — Z419 Encounter for procedure for purposes other than remedying health state, unspecified: Secondary | ICD-10-CM | POA: Diagnosis not present

## 2021-12-15 ENCOUNTER — Encounter: Payer: Self-pay | Admitting: Nurse Practitioner

## 2021-12-15 ENCOUNTER — Ambulatory Visit (INDEPENDENT_AMBULATORY_CARE_PROVIDER_SITE_OTHER): Payer: Medicaid Other | Admitting: Nurse Practitioner

## 2021-12-15 VITALS — BP 113/77 | HR 91 | Temp 98.0°F | Ht 64.0 in | Wt 267.0 lb

## 2021-12-15 DIAGNOSIS — E559 Vitamin D deficiency, unspecified: Secondary | ICD-10-CM | POA: Diagnosis not present

## 2021-12-15 DIAGNOSIS — Z833 Family history of diabetes mellitus: Secondary | ICD-10-CM | POA: Insufficient documentation

## 2021-12-15 DIAGNOSIS — Z6841 Body Mass Index (BMI) 40.0 and over, adult: Secondary | ICD-10-CM | POA: Insufficient documentation

## 2021-12-15 DIAGNOSIS — R5383 Other fatigue: Secondary | ICD-10-CM

## 2021-12-15 DIAGNOSIS — Z0000 Encounter for general adult medical examination without abnormal findings: Secondary | ICD-10-CM | POA: Diagnosis not present

## 2021-12-15 DIAGNOSIS — Z7689 Persons encountering health services in other specified circumstances: Secondary | ICD-10-CM | POA: Diagnosis not present

## 2021-12-15 DIAGNOSIS — L02419 Cutaneous abscess of limb, unspecified: Secondary | ICD-10-CM | POA: Insufficient documentation

## 2021-12-15 DIAGNOSIS — Z113 Encounter for screening for infections with a predominantly sexual mode of transmission: Secondary | ICD-10-CM

## 2021-12-15 DIAGNOSIS — L732 Hidradenitis suppurativa: Secondary | ICD-10-CM | POA: Insufficient documentation

## 2021-12-15 NOTE — Progress Notes (Signed)
New Patient Office Visit  Subjective    Patient ID: Beth Goodwin, female    DOB: Sep 26, 1995  Age: 26 y.o. MRN: [id number]  CC: No chief complaint on file.   HPI Beth Goodwin presents to establish care Has been a few years since she had primary care provider.  Her last pap was 01/28/2022.  Patent would like to have check for STDs and regular labs as soon as possible  No symptoms or concerns for STDs, just likes to be checked.  Patient states that she would like to have some guidance regarding healthy eating. States that she has been obese all of her life and would like to start eating healthier to be healthier in the future  States that she gets boils under her arms and sometimes on her buttocks. She states that changes in detergents, soaps, fragrances, or Deoderant tend to make these more prevalent. She states that applying a warm compress does help most of the time. She has had to have antibiotics to them in the past. She currently does not have one. States that has she has figured out what makes them worse, she gets them less and less often.   Outpatient Encounter Medications as of 12/15/2021  Medication Sig   etonogestrel (NEXPLANON) 68 MG IMPL implant Inject into the skin.   clindamycin (CLEOCIN) 150 MG capsule Take 2 capsules (300 mg total) by mouth 3 (three) times daily. (Patient not taking: Reported on 11/21/2020)   ibuprofen (ADVIL) 800 MG tablet Take 1 tablet (800 mg total) by mouth 3 (three) times daily. (Patient not taking: Reported on 12/15/2021)   sulfamethoxazole-trimethoprim (BACTRIM DS) 800-160 MG tablet Take 1 tablet by mouth 2 (two) times daily. (Patient not taking: Reported on 11/21/2020)   [DISCONTINUED] dicyclomine (BENTYL) 20 MG tablet Take 1 tablet (20 mg total) by mouth 2 (two) times daily. (Patient not taking: Reported on 11/28/2016)   No facility-administered encounter medications on file as of 12/15/2021.    Past Medical History:  Diagnosis Date    Allergy    GERD (gastroesophageal reflux disease)     Past Surgical History:  Procedure Laterality Date   CESAREAN SECTION N/A 09/10/2014   Procedure: CESAREAN SECTION;  Surgeon: Catalina Antigua, MD;  Location: [facility];  Service: Obstetrics;  Laterality: N/A;   CHOLECYSTECTOMY N/A 11/02/2014   Procedure: LAPAROSCOPIC CHOLECYSTECTOMY WITH INTRAOPERATIVE CHOLANGIOGRAM;  Surgeon: Chevis Pretty III, MD;  Location: [facility];  Service: General;  Laterality: N/A;    Family History  Problem Relation Age of Onset   Hypertension Father    Diabetes Father    Hypertension Mother    Diabetes Maternal Aunt    Diabetes Maternal Grandmother    Diabetes Paternal Grandmother     Social History   Socioeconomic History   Marital status: Single    Spouse name: Not on file   Number of children: 1   Years of education: Not on file   Highest education level: Not on file  Occupational History   Not on file  Tobacco Use   Smoking status: Never   Smokeless tobacco: Never  Vaping Use   Vaping Use: Never used  Substance and Sexual Activity   Alcohol use: No   Drug use: Yes    Types: Marijuana    Comment: odaily   Sexual activity: Yes    Birth control/protection: Implant  Other Topics Concern   Not on file  Social History Narrative   Not on file   Social Determinants of  Health   Financial Resource Strain: Not on file  Food Insecurity: Not on file  Transportation Needs: Not on file  Physical Activity: Not on file  Stress: Not on file  Social Connections: Not on file  Intimate Partner Violence: Not on file    Review of Systems  Constitutional:  Negative for chills, fever and malaise/fatigue.  HENT:  Negative for congestion, sinus pain and sore throat.   Eyes: Negative.   Respiratory:  Negative for cough, shortness of breath and wheezing.   Cardiovascular:  Negative for chest pain, palpitations and leg swelling.  Gastrointestinal:  Negative for constipation, diarrhea, nausea and vomiting.   Genitourinary: Negative.   Musculoskeletal:  Negative for myalgias.  Skin: Negative.   Neurological:  Negative for dizziness and headaches.  Endo/Heme/Allergies:  Does not bruise/bleed easily.  Psychiatric/Behavioral:  Negative for depression. The patient is not nervous/anxious.         Objective    Today's Vitals   12/15/21 1107  BP: 113/77  Pulse: 91  Temp: 98 F (36.7 C)  TempSrc: Temporal  Weight: 267 lb (121.1 kg)  Height: 5\' 4"  (1.626 m)   Body mass index is 45.83 kg/m.   Physical Exam Vitals and nursing note reviewed.  Constitutional:      Appearance: Normal appearance. She is well-developed. She is obese.  HENT:     Head: Normocephalic and atraumatic.     Nose: Nose normal.     Mouth/Throat:     Mouth: Mucous membranes are moist.     Pharynx: Oropharynx is clear.  Eyes:     Extraocular Movements: Extraocular movements intact.     Conjunctiva/sclera: Conjunctivae normal.     Pupils: Pupils are equal, round, and reactive to light.  Cardiovascular:     Rate and Rhythm: Normal rate and regular rhythm.     Pulses: Normal pulses.     Heart sounds: Normal heart sounds.  Pulmonary:     Effort: Pulmonary effort is normal.     Breath sounds: Normal breath sounds.  Abdominal:     Palpations: Abdomen is soft.  Musculoskeletal:        General: Normal range of motion.     Cervical back: Normal range of motion and neck supple.  Lymphadenopathy:     Cervical: No cervical adenopathy.  Skin:    General: Skin is warm and dry.     Capillary Refill: Capillary refill takes less than 2 seconds.  Neurological:     General: No focal deficit present.     Mental Status: She is alert and oriented to person, place, and time.  Psychiatric:        Mood and Affect: Mood normal.        Behavior: Behavior normal.        Thought Content: Thought content normal.        Judgment: Judgment normal.        Assessment & Plan:  1. Other fatigue Check thyroid panel and vitamin  d for further evaluation.  - TSH + free T4; Future - VITAMIN D 25 Hydroxy (Vit-D Deficiency, Fractures); Future  2. BMI 45.0-49.9, adult (HCC) Discussed lowering calorie intake to 1500 calories per day and incorporating exercise into daily routine to help lose weight. Written information regarding healthy eating provided. Check labs, include thyroid panel, lipids, cbc, and cmp  - TSH + free T4; Future - Lipid panel; Future - Comprehensive metabolic panel; Future - CBC; Future  3. Vitamin D deficiency Check vitamin d and  treat deficiency as indicated  - VITAMIN D 25 Hydroxy (Vit-D Deficiency, Fractures); Future  4. Screen for STD (sexually transmitted disease) Obtain urine sample to screen for gonorrhea, chlamydia, and trichomoniasis. Check blood for HIV and syphilis. Treat positive results as indicated. Will notify patient of all results via MyChart.  - HIV antibody (with reflex); Future - RPR; Future - GC/Chlamydia Probe Amp; Future - Trichomonas vaginalis, RNA; Future  5. Family history of type 2 diabetes mellitus Check Hgba1c with labs  - Hemoglobin A1c; Future - Comprehensive metabolic panel; Future - CBC; Future  6. Axillary abscess History of axillary abscess. Skin sensitive to changes in skin care regimen. Will treat new abscess formation as indicated. Consider referral to dermatology a s indicated.   7. Encounter to establish care Appointment today to establish new primary care provider.  8. Healthcare maintenance Routine, fasting labs ordered today.  - TSH + free T4; Future - Hemoglobin A1c; Future - Lipid panel; Future - Comprehensive metabolic panel; Future - CBC; Future   Problem List Items Addressed This Visit       Other   Screen for STD (sexually transmitted disease)   Relevant Orders   HIV antibody (with reflex)   RPR   GC/Chlamydia Probe Amp   Trichomonas vaginalis, RNA   BMI 45.0-49.9, adult (HCC)   Relevant Orders   TSH + free T4   Lipid panel    Comprehensive metabolic panel   CBC   Axillary abscess   Family history of type 2 diabetes mellitus   Relevant Orders   Hemoglobin A1c   Comprehensive metabolic panel   CBC   Other Visit Diagnoses     Other fatigue    -  Primary   Relevant Orders   TSH + free T4   VITAMIN D 25 Hydroxy (Vit-D Deficiency, Fractures)   Vitamin D deficiency       Relevant Orders   VITAMIN D 25 Hydroxy (Vit-D Deficiency, Fractures)   Encounter to establish care       Healthcare maintenance       Relevant Orders   TSH + free T4   Hemoglobin A1c   Lipid panel   Comprehensive metabolic panel   CBC       Return in about 2 months (around 02/14/2022) for health maintenance exam, with pap - FBW with STD testing tomororw or in next few days.   Carlean Jews, NP

## 2021-12-19 ENCOUNTER — Other Ambulatory Visit: Payer: Medicaid Other

## 2021-12-19 DIAGNOSIS — Z6841 Body Mass Index (BMI) 40.0 and over, adult: Secondary | ICD-10-CM

## 2021-12-19 DIAGNOSIS — Z113 Encounter for screening for infections with a predominantly sexual mode of transmission: Secondary | ICD-10-CM | POA: Diagnosis not present

## 2021-12-19 DIAGNOSIS — E559 Vitamin D deficiency, unspecified: Secondary | ICD-10-CM

## 2021-12-19 DIAGNOSIS — Z833 Family history of diabetes mellitus: Secondary | ICD-10-CM

## 2021-12-19 DIAGNOSIS — R5383 Other fatigue: Secondary | ICD-10-CM

## 2021-12-19 DIAGNOSIS — Z0000 Encounter for general adult medical examination without abnormal findings: Secondary | ICD-10-CM

## 2021-12-20 LAB — COMPREHENSIVE METABOLIC PANEL
ALT: 22 IU/L (ref 0–32)
AST: 17 IU/L (ref 0–40)
Albumin/Globulin Ratio: 1.4 (ref 1.2–2.2)
Albumin: 4 g/dL (ref 4.0–5.0)
Alkaline Phosphatase: 78 IU/L (ref 44–121)
BUN/Creatinine Ratio: 13 (ref 9–23)
BUN: 9 mg/dL (ref 6–20)
Bilirubin Total: 0.5 mg/dL (ref 0.0–1.2)
CO2: 22 mmol/L (ref 20–29)
Calcium: 8.8 mg/dL (ref 8.7–10.2)
Chloride: 101 mmol/L (ref 96–106)
Creatinine, Ser: 0.71 mg/dL (ref 0.57–1.00)
Globulin, Total: 2.8 g/dL (ref 1.5–4.5)
Glucose: 119 mg/dL — ABNORMAL HIGH (ref 70–99)
Potassium: 4.1 mmol/L (ref 3.5–5.2)
Sodium: 138 mmol/L (ref 134–144)
Total Protein: 6.8 g/dL (ref 6.0–8.5)
eGFR: 122 mL/min/{1.73_m2} (ref >59)

## 2021-12-20 LAB — CBC
Hematocrit: 40.5 % (ref 34.0–46.6)
Hemoglobin: 13 g/dL (ref 11.1–15.9)
MCH: 28.1 pg (ref 26.6–33.0)
MCHC: 32.1 g/dL (ref 31.5–35.7)
MCV: 88 fL (ref 79–97)
Platelets: 320 10*3/uL (ref 150–450)
RBC: 4.62 x10E6/uL (ref 3.77–5.28)
RDW: 12.7 % (ref 11.7–15.4)
WBC: 11.6 10*3/uL — ABNORMAL HIGH (ref 3.4–10.8)

## 2021-12-20 LAB — LIPID PANEL
Chol/HDL Ratio: 3.9 ratio (ref 0.0–4.4)
Cholesterol, Total: 128 mg/dL (ref 100–199)
HDL: 33 mg/dL — ABNORMAL LOW (ref >39)
LDL Chol Calc (NIH): 78 mg/dL (ref 0–99)
Triglycerides: 85 mg/dL (ref 0–149)
VLDL Cholesterol Cal: 17 mg/dL (ref 5–40)

## 2021-12-20 LAB — VITAMIN D 25 HYDROXY (VIT D DEFICIENCY, FRACTURES): Vit D, 25-Hydroxy: 8.2 ng/mL — ABNORMAL LOW (ref 30.0–100.0)

## 2021-12-20 LAB — TSH+FREE T4
Free T4: 1.18 ng/dL (ref 0.82–1.77)
TSH: 0.604 u[IU]/mL (ref 0.450–4.500)

## 2021-12-20 LAB — RPR: RPR Ser Ql: NONREACTIVE

## 2021-12-20 LAB — HIV ANTIBODY (ROUTINE TESTING W REFLEX): HIV Screen 4th Generation wRfx: NONREACTIVE

## 2021-12-20 LAB — HEMOGLOBIN A1C
Est. average glucose Bld gHb Est-mCnc: 128 mg/dL
Hgb A1c MFr Bld: 6.1 % — ABNORMAL HIGH (ref 4.8–5.6)

## 2021-12-21 ENCOUNTER — Other Ambulatory Visit: Payer: Self-pay | Admitting: Nurse Practitioner

## 2021-12-21 ENCOUNTER — Encounter: Payer: Self-pay | Admitting: Nurse Practitioner

## 2021-12-21 DIAGNOSIS — E559 Vitamin D deficiency, unspecified: Secondary | ICD-10-CM

## 2021-12-21 MED ORDER — ERGOCALCIFEROL 1.25 MG (50000 UT) PO CAPS: 50000.0000 [IU] | ORAL_CAPSULE | ORAL | 5 refills | Status: DC

## 2021-12-22 LAB — GC/CHLAMYDIA PROBE AMP
Chlamydia trachomatis, NAA: NEGATIVE
Neisseria Gonorrhoeae by PCR: NEGATIVE

## 2021-12-22 LAB — TRICHOMONAS VAGINALIS, PROBE AMP: Trich vag by NAA: POSITIVE — AB

## 2022-01-01 ENCOUNTER — Other Ambulatory Visit: Payer: Self-pay | Admitting: Nurse Practitioner

## 2022-01-01 DIAGNOSIS — B9689 Other specified bacterial agents as the cause of diseases classified elsewhere: Secondary | ICD-10-CM

## 2022-01-01 MED ORDER — METRONIDAZOLE 500 MG PO TABS: 500.0000 mg | ORAL_TABLET | Freq: Two times a day (BID) | ORAL | 0 refills | Status: DC

## 2022-01-09 DIAGNOSIS — Z419 Encounter for procedure for purposes other than remedying health state, unspecified: Secondary | ICD-10-CM | POA: Diagnosis not present

## 2022-01-10 IMAGING — DX DG KNEE COMPLETE 4+V*R*
4 series · 4 of 4 positions shown · non-contrast
Comparison: None.

CLINICAL DATA: C/o R knee pain and swelling x 1 week. No known
injury.

EXAM:
RIGHT KNEE - COMPLETE 4+ VIEW

[knee ap]
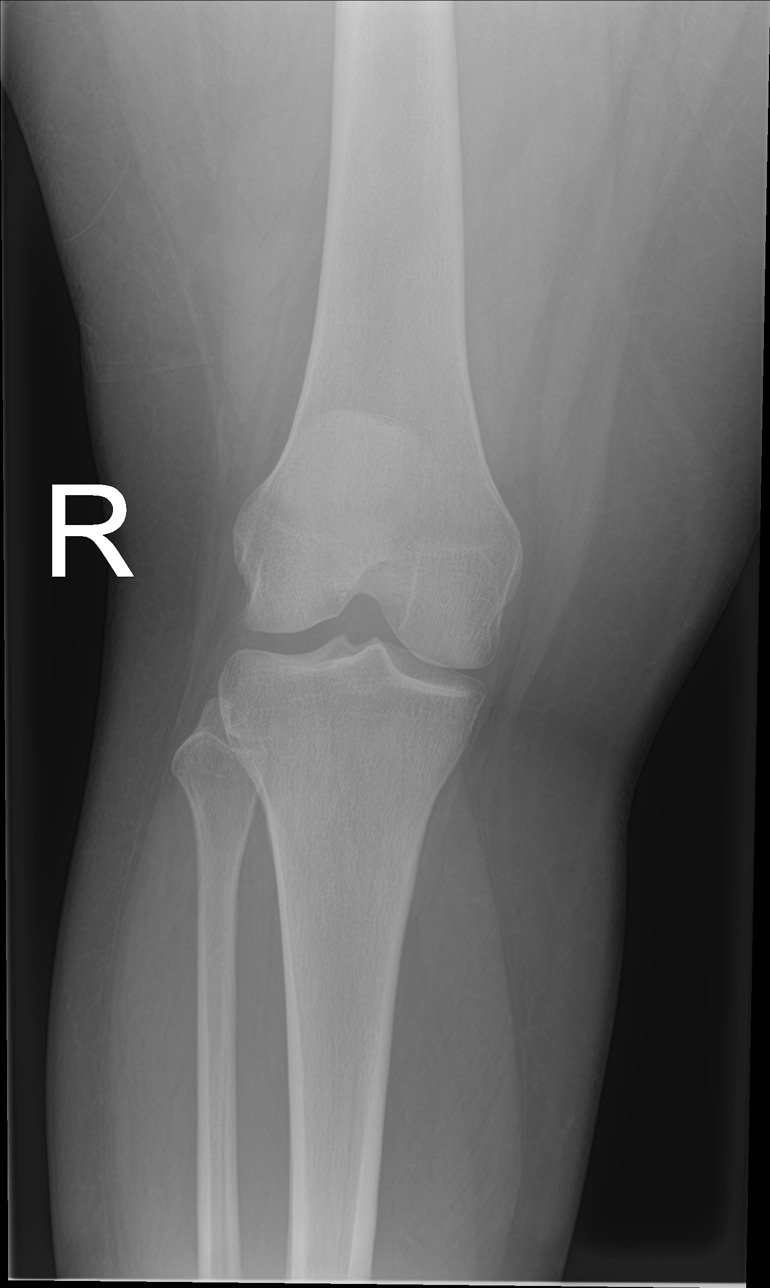

[knee lat]
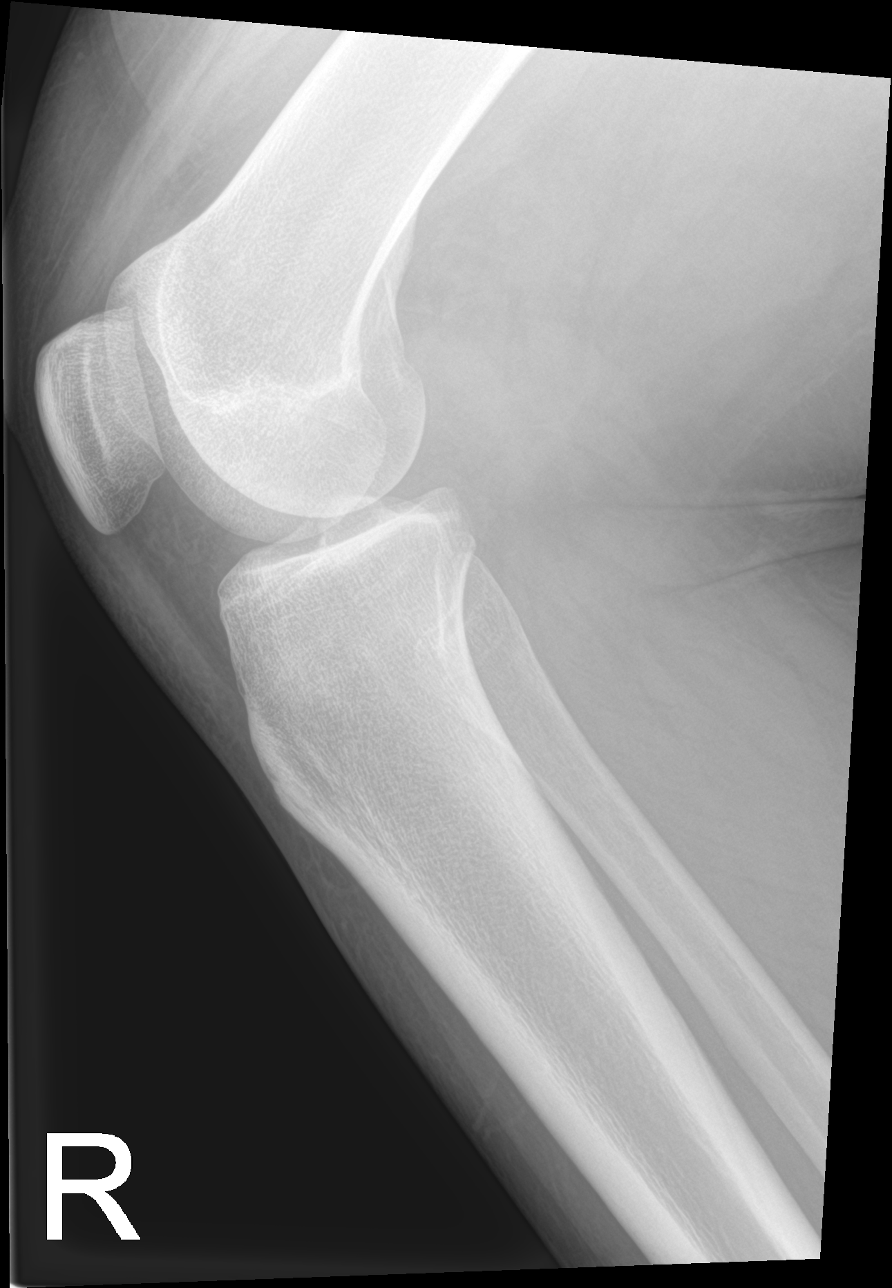

[knee obl (1 of 2)]
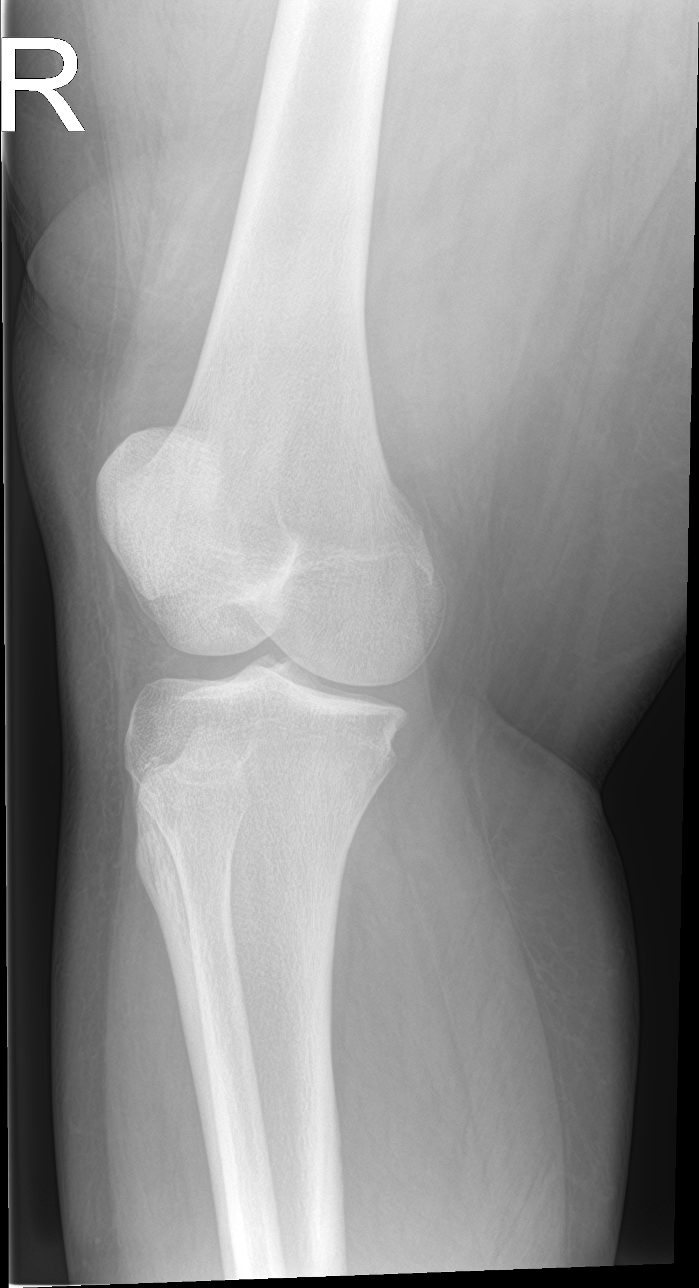

[knee obl (2 of 2)]
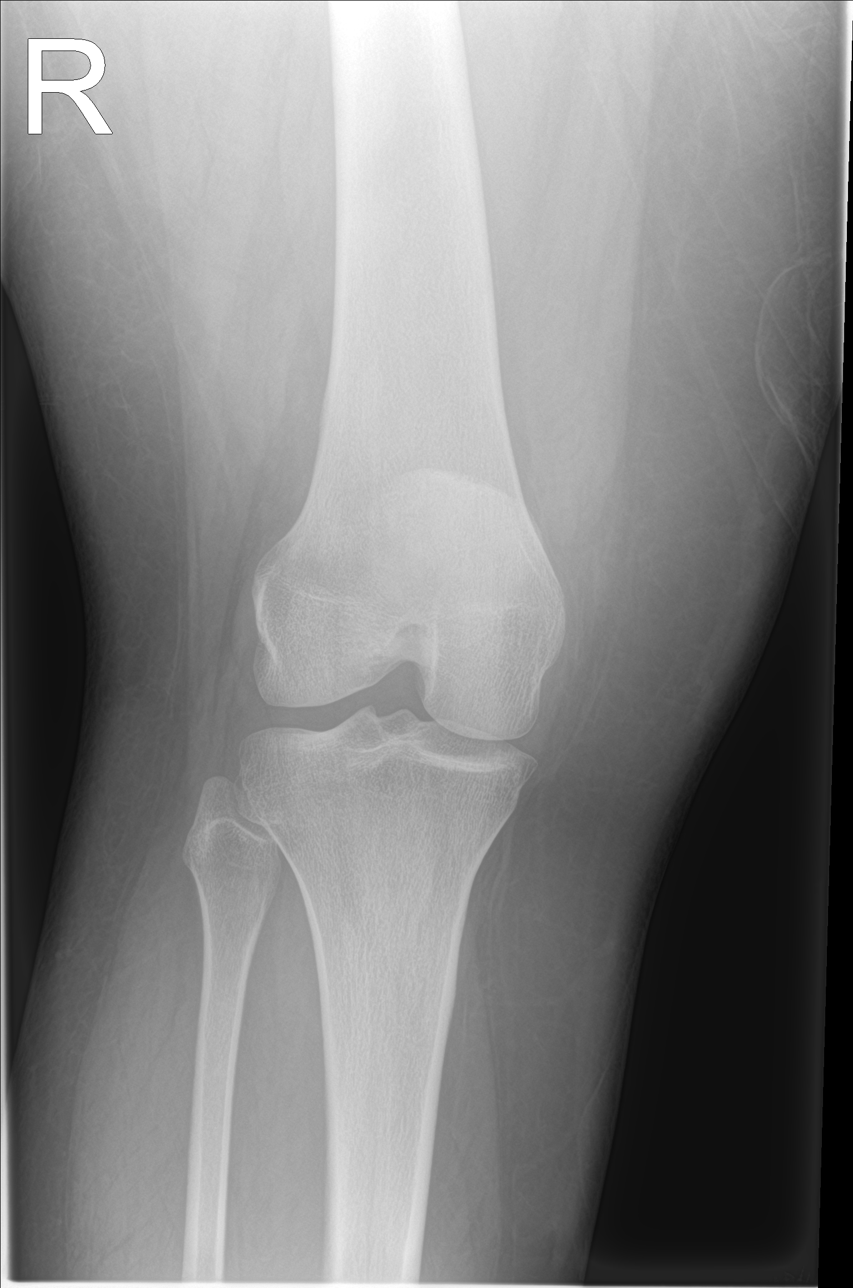

[4 of 4 positions shown; findings below may reference images not displayed]

FINDINGS: No evidence of fracture, dislocation, or joint effusion. No evidence
of arthropathy or other focal bone abnormality. Soft tissues are
unremarkable.
IMPRESSION: Negative radiographs of the right knee.

## 2022-01-19 ENCOUNTER — Encounter: Payer: Medicaid Other | Admitting: Obstetrics and Gynecology

## 2022-02-08 DIAGNOSIS — Z419 Encounter for procedure for purposes other than remedying health state, unspecified: Secondary | ICD-10-CM | POA: Diagnosis not present

## 2022-02-17 ENCOUNTER — Ambulatory Visit (INDEPENDENT_AMBULATORY_CARE_PROVIDER_SITE_OTHER): Payer: Medicaid Other | Admitting: Nurse Practitioner

## 2022-02-17 ENCOUNTER — Other Ambulatory Visit (HOSPITAL_COMMUNITY)
Admission: RE | Admit: 2022-02-17 | Discharge: 2022-02-17 | Disposition: A | Discharge status: Home / Self Care | Payer: Medicaid Other | Source: Ambulatory Visit | Attending: Nurse Practitioner | Admitting: Nurse Practitioner

## 2022-02-17 ENCOUNTER — Encounter: Payer: Self-pay | Admitting: Nurse Practitioner

## 2022-02-17 VITALS — BP 100/69 | HR 96 | Ht 64.0 in | Wt 270.1 lb

## 2022-02-17 DIAGNOSIS — B9689 Other specified bacterial agents as the cause of diseases classified elsewhere: Secondary | ICD-10-CM | POA: Diagnosis not present

## 2022-02-17 DIAGNOSIS — N760 Acute vaginitis: Secondary | ICD-10-CM | POA: Diagnosis not present

## 2022-02-17 DIAGNOSIS — Z6841 Body Mass Index (BMI) 40.0 and over, adult: Secondary | ICD-10-CM | POA: Diagnosis not present

## 2022-02-17 DIAGNOSIS — Z01419 Encounter for gynecological examination (general) (routine) without abnormal findings: Secondary | ICD-10-CM | POA: Insufficient documentation

## 2022-02-17 DIAGNOSIS — E559 Vitamin D deficiency, unspecified: Secondary | ICD-10-CM

## 2022-02-17 NOTE — Progress Notes (Signed)
Complete physical exam   Patient: Beth Goodwin   DOB: Jan 04, 1996   26 y.o. Female  MRN: [id number] Visit Date: 02/17/2022    Chief Complaint  Patient presents with   Gynecologic Exam   Annual Exam   Subjective    Beth Goodwin is a 26 y.o. female who presents today for a complete physical exam.  She reports consuming a  generally healthy  diet. The patient has a physically strenuous job, but has no regular exercise apart from work.  She generally feels well. She does not have additional problems to discuss today.   HPI  Annual physical and pap smear -routine labs done prior to this visit.  --very low vitamin d - already on Drisdol weekly  --blood sugar was slightly elevated with HgbA1c 6.1.  --cholesterol panel was essentially normal.  --has completed treatment for positive trichomonas testing.  --other STI testing was negative   Past Medical History:  Diagnosis Date   Allergy    GERD (gastroesophageal reflux disease)    Past Surgical History:  Procedure Laterality Date   CESAREAN SECTION N/A 09/10/2014   Procedure: CESAREAN SECTION;  Surgeon: Mora Bellman, MD;  Location: [facility];  Service: Obstetrics;  Laterality: N/A;   CHOLECYSTECTOMY N/A 11/02/2014   Procedure: LAPAROSCOPIC CHOLECYSTECTOMY WITH INTRAOPERATIVE CHOLANGIOGRAM;  Surgeon: Autumn Messing III, MD;  Location: [facility];  Service: General;  Laterality: N/A;   Social History   Socioeconomic History   Marital status: Single    Spouse name: Not on file   Number of children: 1   Years of education: Not on file   Highest education level: Not on file  Occupational History   Not on file  Tobacco Use   Smoking status: Never   Smokeless tobacco: Never  Vaping Use   Vaping Use: Never used  Substance and Sexual Activity   Alcohol use: No   Drug use: Yes    Types: Marijuana    Comment: odaily   Sexual activity: Yes    Birth control/protection: Implant  Other Topics Concern   Not on file  Social History  Narrative   Not on file   Social Determinants of Health   Financial Resource Strain: Not on file  Food Insecurity: Not on file  Transportation Needs: Not on file  Physical Activity: Not on file  Stress: Not on file  Social Connections: Not on file  Intimate Partner Violence: Not on file   Family Status  Relation Name Status   Father  Alive   Mother  Deceased       MVA   Mat Aunt  (Not Specified)   MGM  (Not Specified)   PGM  (Not Specified)   Family History  Problem Relation Age of Onset   Hypertension Father    Diabetes Father    Hypertension Mother    Diabetes Maternal Aunt    Diabetes Maternal Grandmother    Diabetes Paternal Grandmother    Allergies  Allergen Reactions   Amoxicillin Hives    Has patient had a PCN reaction causing immediate rash, facial/tongue/throat swelling, SOB or lightheadedness with hypotension: Yes Has patient had a PCN reaction causing severe rash involving mucus membranes or skin necrosis: No Has patient had a PCN reaction that required hospitalization: No Has patient had a PCN reaction occurring within the last 10 years: No If all of the above answers are "NO", then may proceed with Cephalosporin use.     Patient Care Team: Ronnell Freshwater, NP as  PCP - General (Family Medicine)   Medications: Outpatient Medications Prior to Visit  Medication Sig   ergocalciferol (DRISDOL) 1.25 MG (50000 UT) capsule Take 1 capsule (50,000 Units total) by mouth once a week.   etonogestrel (NEXPLANON) 68 MG IMPL implant Inject into the skin.   metroNIDAZOLE (FLAGYL) 500 MG tablet Take 1 tablet (500 mg total) by mouth 2 (two) times daily.   [DISCONTINUED] ibuprofen (ADVIL) 800 MG tablet Take 1 tablet (800 mg total) by mouth 3 (three) times daily. (Patient not taking: Reported on 12/15/2021)   No facility-administered medications prior to visit.    Review of Systems  Constitutional:  Positive for fatigue. Negative for activity change, appetite change,  chills and fever.  HENT:  Negative for congestion, postnasal drip, rhinorrhea, sinus pressure, sinus pain, sneezing and sore throat.   Eyes: Negative.   Respiratory:  Negative for cough, chest tightness, shortness of breath and wheezing.   Cardiovascular:  Negative for chest pain and palpitations.  Gastrointestinal:  Negative for abdominal pain, constipation, diarrhea, nausea and vomiting.  Endocrine: Negative for cold intolerance, heat intolerance, polydipsia and polyuria.  Genitourinary:  Negative for dyspareunia, dysuria, flank pain, frequency and urgency.  Musculoskeletal:  Negative for arthralgias, back pain and myalgias.  Skin:  Negative for rash.  Allergic/Immunologic: Negative for environmental allergies.  Neurological:  Negative for dizziness, weakness and headaches.  Hematological:  Negative for adenopathy.  Psychiatric/Behavioral:  The patient is not nervous/anxious.     Last CBC Lab Results  Component Value Date   WBC 11.6 (H) 12/19/2021   HGB 13.0 12/19/2021   HCT 40.5 12/19/2021   MCV 88 12/19/2021   MCH 28.1 12/19/2021   RDW 12.7 12/19/2021   PLT 320 55/20/8022   Last metabolic panel Lab Results  Component Value Date   GLUCOSE 119 (H) 12/19/2021   NA 138 12/19/2021   K 4.1 12/19/2021   CL 101 12/19/2021   CO2 22 12/19/2021   BUN 9 12/19/2021   CREATININE 0.71 12/19/2021   EGFR 122 12/19/2021   CALCIUM 8.8 12/19/2021   PROT 6.8 12/19/2021   ALBUMIN 4.0 12/19/2021   LABGLOB 2.8 12/19/2021   AGRATIO 1.4 12/19/2021   BILITOT 0.5 12/19/2021   ALKPHOS 78 12/19/2021   AST 17 12/19/2021   ALT 22 12/19/2021   ANIONGAP 9 09/15/2020   Last lipids Lab Results  Component Value Date   CHOL 128 12/19/2021   HDL 33 (L) 12/19/2021   LDLCALC 78 12/19/2021   TRIG 85 12/19/2021   CHOLHDL 3.9 12/19/2021   Last hemoglobin A1c Lab Results  Component Value Date   HGBA1C 6.1 (H) 12/19/2021   Last thyroid functions Lab Results  Component Value Date   TSH 0.604  12/19/2021   Last vitamin D Lab Results  Component Value Date   VD25OH 8.2 (L) 12/19/2021       Objective     Today's Vitals   02/17/22 0943  BP: 100/69  Pulse: 96  SpO2: 100%  Weight: 270 lb 1.9 oz (122.5 kg)  Height: _0  (1.626 m)   Body mass index is 46.37 kg/m.   BP Readings from Last 3 Encounters:  02/17/22 100/69  12/15/21 113/77  07/06/21 125/81    Wt Readings from Last 3 Encounters:  02/17/22 270 lb 1.9 oz (122.5 kg)  12/15/21 267 lb (121.1 kg)  11/21/20 273 lb 6.4 oz (124 kg)     Physical Exam Vitals and nursing note reviewed. Exam conducted with a chaperone present.  Constitutional:  Appearance: Normal appearance. She is well-developed. She is obese.  HENT:     Head: Normocephalic and atraumatic.     Right Ear: Tympanic membrane, ear canal and external ear normal.     Left Ear: Tympanic membrane, ear canal and external ear normal.     Nose: Nose normal.     Mouth/Throat:     Mouth: Mucous membranes are moist.     Pharynx: Oropharynx is clear.  Eyes:     Extraocular Movements: Extraocular movements intact.     Conjunctiva/sclera: Conjunctivae normal.     Pupils: Pupils are equal, round, and reactive to light.  Cardiovascular:     Rate and Rhythm: Normal rate and regular rhythm.     Pulses: Normal pulses.     Heart sounds: Normal heart sounds.  Pulmonary:     Effort: Pulmonary effort is normal.     Breath sounds: Normal breath sounds.  Chest:  Breasts:    Right: Normal. No swelling, bleeding, inverted nipple, mass, nipple discharge, skin change or tenderness.     Left: Normal. No swelling, bleeding, inverted nipple, mass, nipple discharge, skin change or tenderness.  Abdominal:     General: Bowel sounds are normal. There is no distension.     Palpations: Abdomen is soft. There is no mass.     Tenderness: There is no abdominal tenderness. There is no right CVA tenderness, left CVA tenderness, guarding or rebound.     Hernia: No hernia is  present. There is no hernia in the left inguinal area or right inguinal area.  Genitourinary:    General: Normal vulva.     Exam position: Supine.     Labia:        Right: No rash, tenderness, lesion or injury.        Left: No rash, tenderness, lesion or injury.      Vagina: Normal. No signs of injury and foreign body. No vaginal discharge, erythema, tenderness, bleeding, lesions or prolapsed vaginal walls.     Cervix: No cervical motion tenderness, discharge, friability, lesion, erythema, cervical bleeding or eversion.     Uterus: Normal. Not deviated, not enlarged, not fixed, not tender and no uterine prolapse.      Adnexa: Right adnexa normal and left adnexa normal.     Comments: No tenderness, masses, or organomeglay present during bimanual exam .   Musculoskeletal:        General: Normal range of motion.     Cervical back: Normal range of motion and neck supple.  Lymphadenopathy:     Cervical: No cervical adenopathy.     Upper Body:     Right upper body: No axillary adenopathy.     Left upper body: No axillary adenopathy.     Lower Body: No right inguinal adenopathy. No left inguinal adenopathy.  Skin:    General: Skin is warm and dry.     Capillary Refill: Capillary refill takes less than 2 seconds.  Neurological:     General: No focal deficit present.     Mental Status: She is alert and oriented to person, place, and time.  Psychiatric:        Mood and Affect: Mood normal.        Behavior: Behavior normal.        Thought Content: Thought content normal.        Judgment: Judgment normal.       Last depression screening scores   Row Labels 12/15/2021   11:11 AM 11/21/2020  2:49 PM 05/23/2020    1:23 PM  PHQ 2/9 Scores   Section Header. No data exists in this row.     PHQ - 2 Score   1 0 0  PHQ- 9 Score   6 0    Last fall risk screening   Row Labels 12/15/2021   11:11 AM  [place]. No data exists in this row.   Falls in the past year?   1   Number falls in past yr:   1  Injury with Fall?   0  Risk for fall due to :   History of fall(s)  Follow up   Falls evaluation completed      Results for orders placed or performed in visit on 02/17/22  Cytology - PAP( [facility])  Result Value Ref Range   Adequacy      Satisfactory for evaluation; transformation zone component ABSENT.   Diagnosis      - Negative for intraepithelial lesion or malignancy (NILM)    Assessment & Plan    1. Well woman exam Annual physical with pap smear today  - Cytology - PAP( [facility])  2. Vitamin D deficiency Currently taking drisdol 50000 iu weekly. Continue until refills finished. Follow with over the counter vitamin d 5000 iu daily   3. Bacterial vaginitis Resolved. Completed antibiotic therapy.   4. BMI 45.0-49.9, adult (HCC) Discussed lowering calorie intake to 1500 calories per day and incorporating exercise into daily routine to help lose weight.     Immunization History  Administered Date(s) Administered   Influenza,inj,Quad PF,6+ Mos 06/28/2014   Tdap 06/28/2014, 07/06/2015    Health Maintenance  Topic Date Due   COVID-19 Vaccine (1) Never done   HPV VACCINES (1 - 2-dose series) Never done   INFLUENZA VACCINE  12/09/2021   CHLAMYDIA SCREENING  12/20/2022   PAP-Cervical Cytology Screening  02/17/2025   PAP SMEAR-Modifier  02/17/2025   TETANUS/TDAP  07/05/2025   Hepatitis C Screening  Completed   HIV Screening  Completed    Discussed health benefits of physical activity, and encouraged her to engage in regular exercise appropriate for her age and condition.  Problem List Items Addressed This Visit       Genitourinary   Bacterial vaginitis     Other   BMI 45.0-49.9, adult ([place])   Vitamin D deficiency   Other Visit Diagnoses     Well woman exam    -  Primary   Relevant Orders   Cytology - PAP( [facility]) (Completed)        Return in about 1 year (around 02/18/2023) for health maintenance exam, FBW a  week prior to visit.        Ronnell Freshwater, NP  [facility] at [facility] [phone] (phone) [phone] (fax)  [place]

## 2022-02-18 LAB — CYTOLOGY - PAP
Adequacy: ABSENT
Diagnosis: NEGATIVE

## 2022-02-23 ENCOUNTER — Encounter: Payer: Self-pay | Admitting: Nurse Practitioner

## 2022-03-05 DIAGNOSIS — E559 Vitamin D deficiency, unspecified: Secondary | ICD-10-CM | POA: Insufficient documentation

## 2022-03-05 DIAGNOSIS — B9689 Other specified bacterial agents as the cause of diseases classified elsewhere: Secondary | ICD-10-CM | POA: Insufficient documentation

## 2022-03-11 DIAGNOSIS — Z419 Encounter for procedure for purposes other than remedying health state, unspecified: Secondary | ICD-10-CM | POA: Diagnosis not present

## 2022-04-10 DIAGNOSIS — Z419 Encounter for procedure for purposes other than remedying health state, unspecified: Secondary | ICD-10-CM | POA: Diagnosis not present

## 2022-05-11 DIAGNOSIS — Z419 Encounter for procedure for purposes other than remedying health state, unspecified: Secondary | ICD-10-CM | POA: Diagnosis not present

## 2022-05-18 ENCOUNTER — Encounter: Payer: Self-pay | Admitting: Nurse Practitioner

## 2022-05-21 ENCOUNTER — Encounter: Payer: Self-pay | Admitting: Nurse Practitioner

## 2022-05-21 ENCOUNTER — Ambulatory Visit (INDEPENDENT_AMBULATORY_CARE_PROVIDER_SITE_OTHER): Payer: Medicaid Other | Admitting: Nurse Practitioner

## 2022-05-21 VITALS — BP 124/87 | HR 78 | Resp 18 | Ht 64.0 in | Wt 269.0 lb

## 2022-05-21 DIAGNOSIS — J3089 Other allergic rhinitis: Secondary | ICD-10-CM | POA: Insufficient documentation

## 2022-05-21 DIAGNOSIS — J309 Allergic rhinitis, unspecified: Secondary | ICD-10-CM | POA: Insufficient documentation

## 2022-05-21 DIAGNOSIS — H1010 Acute atopic conjunctivitis, unspecified eye: Secondary | ICD-10-CM | POA: Insufficient documentation

## 2022-05-21 DIAGNOSIS — Z6841 Body Mass Index (BMI) 40.0 and over, adult: Secondary | ICD-10-CM

## 2022-05-21 MED ORDER — FLUTICASONE PROPIONATE 50 MCG/ACT NA SUSP: 2.0000 | Freq: Every day | NASAL | 6 refills | Status: DC

## 2022-05-21 NOTE — Progress Notes (Signed)
Established patient visit   Patient: Beth Goodwin   DOB: [birth year]   27 y.o. Female  MRN: [id number] Visit Date: 05/21/2022   Chief Complaint  Patient presents with   Allergic Reaction   Subjective    Allergic Reaction This is a chronic problem. The current episode started more than 1 week ago. The problem occurs constantly. The problem is unchanged. The problem is moderate. The time of exposure is not relevant (no exposure). The exposure occurred at Home and work. Associated symptoms include coughing. Pertinent negatives include no abdominal pain, chest pain, diarrhea, eye itching, rash, vomiting or wheezing. (Nasal congestion, sinus problems.) There is no swelling present. Past treatments include diphenhydramine and rest (oral antihistamine). The treatment provided mild relief. Her past medical history is significant for medication allergies.       Medications: Outpatient Medications Prior to Visit  Medication Sig   ergocalciferol (DRISDOL) 1.25 MG (50000 UT) capsule Take 1 capsule (50,000 Units total) by mouth once a week.   etonogestrel (NEXPLANON) 68 MG IMPL implant Inject into the skin.   metroNIDAZOLE (FLAGYL) 500 MG tablet Take 1 tablet (500 mg total) by mouth 2 (two) times daily. (Patient not taking: Reported on 05/21/2022)   No facility-administered medications prior to visit.    Review of Systems  Constitutional:  Negative for activity change, appetite change, chills, fatigue and fever.  HENT:  Positive for congestion, postnasal drip, rhinorrhea and sneezing. Negative for sinus pressure, sinus pain and sore throat.   Eyes: Negative.  Negative for itching.  Respiratory:  Positive for cough. Negative for chest tightness, shortness of breath and wheezing.   Cardiovascular:  Negative for chest pain and palpitations.  Gastrointestinal:  Negative for abdominal pain, constipation, diarrhea, nausea and vomiting.  Endocrine: Negative for cold intolerance, heat intolerance,  polydipsia and polyuria.  Genitourinary:  Negative for dyspareunia, dysuria, flank pain, frequency and urgency.  Musculoskeletal:  Negative for arthralgias, back pain and myalgias.  Skin:  Negative for rash.  Allergic/Immunologic: Positive for environmental allergies.  Neurological:  Positive for headaches. Negative for dizziness and weakness.  Hematological:  Negative for adenopathy.  Psychiatric/Behavioral:  The patient is not nervous/anxious.     Last CBC Lab Results  Component Value Date   WBC 11.6 (H) 12/19/2021   HGB 13.0 12/19/2021   HCT 40.5 12/19/2021   MCV 88 12/19/2021   MCH 28.1 12/19/2021   RDW 12.7 12/19/2021   PLT 320 74/25/9563   Last metabolic panel Lab Results  Component Value Date   GLUCOSE 119 (H) 12/19/2021   NA 138 12/19/2021   K 4.1 12/19/2021   CL 101 12/19/2021   CO2 22 12/19/2021   BUN 9 12/19/2021   CREATININE 0.71 12/19/2021   EGFR 122 12/19/2021   CALCIUM 8.8 12/19/2021   PROT 6.8 12/19/2021   ALBUMIN 4.0 12/19/2021   LABGLOB 2.8 12/19/2021   AGRATIO 1.4 12/19/2021   BILITOT 0.5 12/19/2021   ALKPHOS 78 12/19/2021   AST 17 12/19/2021   ALT 22 12/19/2021   ANIONGAP 9 09/15/2020   Last lipids Lab Results  Component Value Date   CHOL 128 12/19/2021   HDL 33 (L) 12/19/2021   LDLCALC 78 12/19/2021   TRIG 85 12/19/2021   CHOLHDL 3.9 12/19/2021   Last hemoglobin A1c Lab Results  Component Value Date   HGBA1C 6.1 (H) 12/19/2021   Last thyroid functions Lab Results  Component Value Date   TSH 0.604 12/19/2021   Last vitamin D Lab Results  Component Value  Date   VD25OH 8.2 (L) 12/19/2021       Objective     Today's Vitals   05/21/22 0940  BP: 124/87  Pulse: 78  Resp: 18  SpO2: 98%  Weight: 269 lb (122 kg)  Height: 5\' 4"  (1.626 m)   Body mass index is 46.17 kg/m.  BP Readings from Last 3 Encounters:  05/21/22 124/87  02/17/22 100/69  12/15/21 113/77    Wt Readings from Last 3 Encounters:  05/21/22 269 lb (122  kg)  02/17/22 270 lb 1.9 oz (122.5 kg)  12/15/21 267 lb (121.1 kg)    Physical Exam Vitals and nursing note reviewed.  Constitutional:      Appearance: Normal appearance. She is well-developed. She is obese.  HENT:     Head: Normocephalic and atraumatic.     Nose: Congestion and rhinorrhea present.     Mouth/Throat:     Mouth: Mucous membranes are moist.     Pharynx: Oropharynx is clear.  Eyes:     Extraocular Movements: Extraocular movements intact.     Conjunctiva/sclera: Conjunctivae normal.     Pupils: Pupils are equal, round, and reactive to light.  Cardiovascular:     Rate and Rhythm: Normal rate and regular rhythm.     Pulses: Normal pulses.     Heart sounds: Normal heart sounds.  Pulmonary:     Effort: Pulmonary effort is normal.     Breath sounds: Normal breath sounds.  Abdominal:     Palpations: Abdomen is soft.  Musculoskeletal:        General: Normal range of motion.     Cervical back: Normal range of motion and neck supple.  Lymphadenopathy:     Cervical: No cervical adenopathy.  Skin:    General: Skin is warm and dry.     Capillary Refill: Capillary refill takes less than 2 seconds.  Neurological:     General: No focal deficit present.     Mental Status: She is alert and oriented to person, place, and time.  Psychiatric:        Mood and Affect: Mood normal.        Behavior: Behavior normal.        Thought Content: Thought content normal.        Judgment: Judgment normal.       Assessment & Plan    1. Chronic allergic rhinitis Add flonase nasal spray - 2 sprays in both nostrils daily. Continue with current treatment with xyzal and/or diphenhydramine. Consider adding singulair. Check basic food allergy panel and allergens for this geographic area. Refer to allergist as indicated.  - Allergen Profile, Basic - fluticasone (FLONASE) 50 MCG/ACT nasal spray; Place 2 sprays into both nostrils daily.  Dispense: 16 g; Refill: 6 - Allergens w/Comp Rflx Area  2  2. BMI 45.0-49.9, adult (HCC) Discussed lowering calorie intake to 1500 calories per day and incorporating exercise into daily routine to help lose weight.     Problem List Items Addressed This Visit       Respiratory   Chronic allergic rhinitis - Primary   Relevant Medications   fluticasone (FLONASE) 50 MCG/ACT nasal spray   Other Relevant Orders   Allergen Profile, Basic   Allergens w/Comp Rflx Area 2     Other   BMI 45.0-49.9, adult ([place])     Return for prn worsening or persistent symptoms.         Ronnell Freshwater, NP  [facility]  [phone] (phone) [phone] (fax)  [place]

## 2022-05-24 LAB — ALLERGENS W/COMP RFLX AREA 2

## 2022-05-26 ENCOUNTER — Encounter: Payer: Self-pay | Admitting: Nurse Practitioner

## 2022-05-26 LAB — ALLERGENS W/COMP RFLX AREA 2
Alternaria Alternata IgE: <0.1 kU/L
Aspergillus Fumigatus IgE: <0.1 kU/L
Bermuda Grass IgE: 0.12 kU/L — AB
Cedar, Mountain IgE: 0.13 kU/L — AB
Cladosporium Herbarum IgE: <0.1 kU/L
Cockroach, German IgE: 0.16 kU/L — AB
Common Silver Birch IgE: 2.92 kU/L — AB
Cottonwood IgE: 0.26 kU/L — AB
D Farinae IgE: 0.62 kU/L — AB
D Pteronyssinus IgE: 0.92 kU/L — AB
E001-IgE Cat Dander: <0.1 kU/L
E005-IgE Dog Dander: 0.35 kU/L — AB
Elm, American IgE: 1.19 kU/L — AB
IgE (Immunoglobulin E), Serum: 121 IU/mL (ref 6–495)
Johnson Grass IgE: 0.27 kU/L — AB
Maple/Box Elder IgE: 0.53 kU/L — AB
Mouse Urine IgE: <0.1 kU/L
Oak, White IgE: <0.1 kU/L
Pecan, Hickory IgE: 0.12 kU/L — AB
Penicillium Chrysogen IgE: <0.1 kU/L
Pigweed, Rough IgE: 0.54 kU/L — AB
Ragweed, Short IgE: <0.1 kU/L
Sheep Sorrel IgE Qn: <0.1 kU/L
Timothy Grass IgE: <0.1 kU/L
White Mulberry IgE: 0.18 kU/L — AB

## 2022-05-26 LAB — PANEL 606648
E101-IgE Can f 1: <0.1 kU/L
E102-IgE Can f 2: <0.1 kU/L
E221-IgE Can f 3: <0.1 kU/L
E226-IgE Can f 5: 0.46 kU/L — AB

## 2022-05-26 LAB — ALLERGEN PROFILE, BASIC
Codfish IgE: <0.1 kU/L
Egg White IgE: <0.1 kU/L
Milk IgE: 2.11 kU/L — AB
Peanut IgE: 0.61 kU/L — AB
Soybean IgE: 0.1 kU/L — AB
Wheat IgE: 0.13 kU/L — AB

## 2022-06-11 DIAGNOSIS — Z419 Encounter for procedure for purposes other than remedying health state, unspecified: Secondary | ICD-10-CM | POA: Diagnosis not present

## 2022-06-19 NOTE — Telephone Encounter (Signed)
Beth Goodwin to place referral for allergist.

## 2022-06-22 ENCOUNTER — Other Ambulatory Visit: Payer: Self-pay

## 2022-06-22 DIAGNOSIS — Z9109 Other allergy status, other than to drugs and biological substances: Secondary | ICD-10-CM

## 2022-06-22 NOTE — Telephone Encounter (Signed)
Referral was placed 06/22/2022

## 2022-07-10 DIAGNOSIS — Z419 Encounter for procedure for purposes other than remedying health state, unspecified: Secondary | ICD-10-CM | POA: Diagnosis not present

## 2022-07-14 NOTE — Progress Notes (Unsigned)
New Patient Note  RE: DEARRA COBOS MRN: [id number] DOB: August 12, 1995 Date of Office Visit: 07/15/2022  Consult requested by: Ronnell Freshwater, NP Primary care provider: Ronnell Freshwater, NP  Chief Complaint: No chief complaint on file.  History of Present Illness: I had the pleasure of seeing Barbette Pasko for initial evaluation at the [facility] [place] on 07/14/2022. She is a 27 y.o. female, who is referred here by Ronnell Freshwater, NP for the evaluation of environmental allergies.  She reports symptoms of ***. Symptoms have been going on for *** years. The symptoms are present *** all year around with worsening in ***. Other triggers include exposure to ***. Anosmia: ***. Headache: ***. She has used *** with ***fair improvement in symptoms. Sinus infections: ***. Previous work up includes: ***. Previous ENT evaluation: ***. Previous sinus imaging: ***. History of nasal polyps: ***. Last eye exam: ***. History of reflux: ***.  Assessment and Plan: Sherilynn is a 27 y.o. female with: No problem-specific Assessment & Plan notes found for this encounter.  No follow-ups on file.  No orders of the defined types were placed in this encounter.  Lab Orders  No laboratory test(s) ordered today    Other allergy screening: Asthma: {Blank single:19197::"yes","no"} Rhino conjunctivitis: {Blank single:19197::"yes","no"} Food allergy: {Blank single:19197::"yes","no"} Medication allergy: {Blank single:19197::"yes","no"} Hymenoptera allergy: {Blank single:19197::"yes","no"} Urticaria: {Blank single:19197::"yes","no"} Eczema:{Blank single:19197::"yes","no"} History of recurrent infections suggestive of immunodeficency: {Blank single:19197::"yes","no"}  Diagnostics: Spirometry:  Tracings reviewed. Her effort: {Blank single:19197::"Good reproducible efforts.","It was hard to get consistent efforts and there is a question as to whether this reflects a maximal  maneuver.","Poor effort, data can not be interpreted."} FVC: ***L FEV1: ***L, ***% predicted FEV1/FVC ratio: ***% Interpretation: {Blank single:19197::"Spirometry consistent with mild obstructive disease","Spirometry consistent with moderate obstructive disease","Spirometry consistent with severe obstructive disease","Spirometry consistent with possible restrictive disease","Spirometry consistent with mixed obstructive and restrictive disease","Spirometry uninterpretable due to technique","Spirometry consistent with normal pattern","No overt abnormalities noted given today's efforts"}.  Please see scanned spirometry results for details.  Skin Testing: {Blank single:19197::"Select foods","Environmental allergy panel","Environmental allergy panel and select foods","Food allergy panel","None","Deferred due to recent antihistamines use"}. *** Results discussed with patient/family.   Past Medical History: Patient Active Problem List   Diagnosis Date Noted  . Chronic allergic rhinitis 05/21/2022  . Vitamin D deficiency 03/05/2022  . Bacterial vaginitis 03/05/2022  . BMI 45.0-49.9, adult ([place]) 12/15/2021  . Axillary abscess 12/15/2021  . Family history of type 2 diabetes mellitus 12/15/2021  . Screen for STD (sexually transmitted disease) 05/23/2020  . Nexplanon in place, replaced 12/03/17 09/06/2017  . Morbid obesity ([place]) 09/06/2017   Past Medical History:  Diagnosis Date  . Allergy   . GERD (gastroesophageal reflux disease)    Past Surgical History: Past Surgical History:  Procedure Laterality Date  . CESAREAN SECTION N/A 09/10/2014   Procedure: CESAREAN SECTION;  Surgeon: Mora Bellman, MD;  Location: [facility];  Service: Obstetrics;  Laterality: N/A;  . CHOLECYSTECTOMY N/A 11/02/2014   Procedure: LAPAROSCOPIC CHOLECYSTECTOMY WITH INTRAOPERATIVE CHOLANGIOGRAM;  Surgeon: Autumn Messing III, MD;  Location: [facility];  Service: General;  Laterality: N/A;   Medication List:  Current Outpatient  Medications  Medication Sig Dispense Refill  . ergocalciferol (DRISDOL) 1.25 MG (50000 UT) capsule Take 1 capsule (50,000 Units total) by mouth once a week. 4 capsule 5  . etonogestrel (NEXPLANON) 68 MG IMPL implant Inject into the skin.    . fluticasone (FLONASE) 50 MCG/ACT nasal spray Place 2 sprays into both nostrils daily.  16 g 6  . metroNIDAZOLE (FLAGYL) 500 MG tablet Take 1 tablet (500 mg total) by mouth 2 (two) times daily. (Patient not taking: Reported on 05/21/2022) 14 tablet 0   No current facility-administered medications for this visit.   Allergies: Allergies  Allergen Reactions  . Amoxicillin Hives    Has patient had a PCN reaction causing immediate rash, facial/tongue/throat swelling, SOB or lightheadedness with hypotension: Yes Has patient had a PCN reaction causing severe rash involving mucus membranes or skin necrosis: No Has patient had a PCN reaction that required hospitalization: No Has patient had a PCN reaction occurring within the last 10 years: No If all of the above answers are "NO", then may proceed with Cephalosporin use.    Social History: Social History   Socioeconomic History  . Marital status: Single    Spouse name: Not on file  . Number of children: 1  . Years of education: Not on file  . Highest education level: Not on file  Occupational History  . Not on file  Tobacco Use  . Smoking status: Never  . Smokeless tobacco: Never  Vaping Use  . Vaping Use: Never used  Substance and Sexual Activity  . Alcohol use: No  . Drug use: Yes    Types: Marijuana    Comment: odaily  . Sexual activity: Yes    Birth control/protection: Implant  Other Topics Concern  . Not on file  Social History Narrative  . Not on file   Social Determinants of Health   Financial Resource Strain: Not on file  Food Insecurity: Not on file  Transportation Needs: Not on file  Physical Activity: Not on file  Stress: Not on file  Social Connections: Not on file    Lives in a ***. Smoking: *** Occupation: ***  Environmental HistoryFreight forwarder in the house: Estate agent in the family room: {Blank single:19197::"yes","no"} Carpet in the bedroom: {Blank single:19197::"yes","no"} Heating: {Blank single:19197::"electric","gas","heat pump"} Cooling: {Blank single:19197::"central","window","heat pump"} Pet: {Blank single:19197::"yes ***","no"}  Family History: Family History  Problem Relation Age of Onset  . Hypertension Father   . Diabetes Father   . Hypertension Mother   . Diabetes Maternal Aunt   . Diabetes Maternal Grandmother   . Diabetes Paternal Grandmother    Problem                               Relation Asthma                                   *** Eczema                                *** Food allergy                          *** Allergic rhino conjunctivitis     ***  Review of Systems  Constitutional:  Negative for appetite change, chills, fever and unexpected weight change.  HENT:  Negative for congestion and rhinorrhea.   Eyes:  Negative for itching.  Respiratory:  Negative for cough, chest tightness, shortness of breath and wheezing.   Cardiovascular:  Negative for chest pain.  Gastrointestinal:  Negative for abdominal pain.  Genitourinary:  Negative for difficulty urinating.  Skin:  Negative for rash.  Neurological:  Negative for headaches.   Objective: There were no vitals taken for this visit. There is no height or weight on file to calculate BMI. Physical Exam Vitals and nursing note reviewed.  Constitutional:      Appearance: Normal appearance. She is well-developed.  HENT:     Head: Normocephalic and atraumatic.     Right Ear: Tympanic membrane and external ear normal.     Left Ear: Tympanic membrane and external ear normal.     Nose: Nose normal.     Mouth/Throat:     Mouth: Mucous membranes are moist.     Pharynx: Oropharynx is clear.  Eyes:     Conjunctiva/sclera:  Conjunctivae normal.  Cardiovascular:     Rate and Rhythm: Normal rate and regular rhythm.     Heart sounds: Normal heart sounds. No murmur heard.    No friction rub. No gallop.  Pulmonary:     Effort: Pulmonary effort is normal.     Breath sounds: Normal breath sounds. No wheezing, rhonchi or rales.  Musculoskeletal:     Cervical back: Neck supple.  Skin:    General: Skin is warm.     Findings: No rash.  Neurological:     Mental Status: She is alert and oriented to person, place, and time.  Psychiatric:        Behavior: Behavior normal.  The plan was reviewed with the patient/family, and all questions/concerned were addressed.  It was my pleasure to see Latrece today and participate in her care. Please feel free to contact me with any questions or concerns.  Sincerely,  Rexene Alberts, DO Allergy & Immunology  [facility] office: [place] office: [phone]

## 2022-07-15 ENCOUNTER — Ambulatory Visit (INDEPENDENT_AMBULATORY_CARE_PROVIDER_SITE_OTHER): Payer: Medicaid Other | Admitting: Allergy

## 2022-07-15 ENCOUNTER — Encounter: Payer: Self-pay | Admitting: Allergy

## 2022-07-15 ENCOUNTER — Other Ambulatory Visit: Payer: Self-pay

## 2022-07-15 VITALS — BP 124/76 | HR 80 | Temp 98.1°F | Resp 18 | Ht 64.0 in | Wt 270.2 lb

## 2022-07-15 DIAGNOSIS — J3089 Other allergic rhinitis: Secondary | ICD-10-CM

## 2022-07-15 DIAGNOSIS — L853 Xerosis cutis: Secondary | ICD-10-CM | POA: Diagnosis not present

## 2022-07-15 DIAGNOSIS — Z880 Allergy status to penicillin: Secondary | ICD-10-CM | POA: Insufficient documentation

## 2022-07-15 DIAGNOSIS — H1013 Acute atopic conjunctivitis, bilateral: Secondary | ICD-10-CM | POA: Diagnosis not present

## 2022-07-15 MED ORDER — AZELASTINE HCL 0.1 % NA SOLN: 1.0000 | Freq: Two times a day (BID) | NASAL | 3 refills | Status: DC | PRN

## 2022-07-15 MED ORDER — MONTELUKAST SODIUM 10 MG PO TABS: 10.0000 mg | ORAL_TABLET | Freq: Every day | ORAL | 3 refills | Status: DC

## 2022-07-15 MED ORDER — LEVOCETIRIZINE DIHYDROCHLORIDE 5 MG PO TABS: 5.0000 mg | ORAL_TABLET | Freq: Every evening | ORAL | 3 refills | Status: DC

## 2022-07-15 NOTE — Assessment & Plan Note (Signed)
.   See assessment and plan as above. 

## 2022-07-15 NOTE — Assessment & Plan Note (Signed)
Amoxicillin caused hives on 2 separate occasions. Continue avoidance for now.

## 2022-07-15 NOTE — Patient Instructions (Addendum)
Today's skin testing showed: Positive to grass, ragweed, weed, trees, dust mites, dog. 2024 bloodwork was positive to dust mites, tree pollen.  Results given.  Environmental allergies Start environmental control measures as below. Use over the counter antihistamines such as Zyrtec (cetirizine), Claritin (loratadine), Allegra (fexofenadine), or Xyzal (levocetirizine) daily as needed. May take twice a day during allergy flares. May switch antihistamines every few months. Start Singulair (montelukast) '10mg'$  daily at night. Cautioned that in some children/adults can experience behavioral changes including hyperactivity, agitation, depression, sleep disturbances and suicidal ideations. These side effects are rare, but if you notice them you should notify me and discontinue Singulair (montelukast). Use Flonase (fluticasone) nasal spray 1 spray per nostril twice a day as needed for nasal congestion.  Use azelastine nasal spray 1-2 sprays per nostril twice a day as needed for runny nose/drainage. Nasal saline spray (i.e., Simply Saline) or nasal saline lavage (i.e., NeilMed) is recommended as needed and prior to medicated nasal sprays. Consider allergy injections for long term control if above medications do not help the symptoms - handout given.   Skin You have very dry skin. See below for proper skin care.  Recommend eye exam  Follow up in 3 months or sooner if needed.    Control of House Dust Mite Allergen Dust mite allergens are a common trigger of allergy and asthma symptoms. While they can be found throughout the house, these microscopic creatures thrive in warm, humid environments such as bedding, upholstered furniture and carpeting. Because so much time is spent in the bedroom, it is essential to reduce mite levels there.  Encase pillows, mattresses, and box springs in special allergen-proof fabric covers or airtight, zippered plastic covers.  Bedding should be washed weekly in hot water  (130 F) and dried in a hot dryer. Allergen-proof covers are available for comforters and pillows that can't be regularly washed.  Wash the allergy-proof covers every few months. Minimize clutter in the bedroom. Keep pets out of the bedroom.  Keep humidity less than 50% by using a dehumidifier or air conditioning. You can buy a humidity measuring device called a hygrometer to monitor this.  If possible, replace carpets with hardwood, linoleum, or washable area rugs. If that's not possible, vacuum frequently with a vacuum that has a HEPA filter. Remove all upholstered furniture and non-washable window drapes from the bedroom. Remove all non-washable stuffed toys from the bedroom.  Wash stuffed toys weekly. Reducing Pollen Exposure Pollen seasons: trees (spring), grass (summer) and ragweed/weeds (fall). Keep windows closed in your home and car to lower pollen exposure.  Install air conditioning in the bedroom and throughout the house if possible.  Avoid going out in dry windy days - especially early morning. Pollen counts are highest between 5 - 10 AM and on dry, hot and windy days.  Save outside activities for late afternoon or after a heavy rain, when pollen levels are lower.  Avoid mowing of grass if you have grass pollen allergy. Be aware that pollen can also be transported indoors on people and pets.  Dry your clothes in an automatic dryer rather than hanging them outside where they might collect pollen.  Rinse hair and eyes before bedtime. Pet Allergen Avoidance: Contrary to popular opinion, there are no "hypoallergenic" breeds of dogs or cats. That is because people are not allergic to an animal's hair, but to an allergen found in the animal's saliva, dander (dead skin flakes) or urine. Pet allergy symptoms typically occur within minutes. For some people, symptoms  can build up and become most severe 8 to 12 hours after contact with the animal. People with severe allergies can experience  reactions in public places if dander has been transported on the pet owners' clothing. Keeping an animal outdoors is only a partial solution, since homes with pets in the yard still have higher concentrations of animal allergens. Before getting a pet, ask your allergist to determine if you are allergic to animals. If your pet is already considered part of your family, try to minimize contact and keep the pet out of the bedroom and other rooms where you spend a great deal of time. As with dust mites, vacuum carpets often or replace carpet with a hardwood floor, tile or linoleum. High-efficiency particulate air (HEPA) cleaners can reduce allergen levels over time. While dander and saliva are the source of cat and dog allergens, urine is the source of allergens from rabbits, hamsters, mice and [place] pigs; so ask a non-allergic family member to clean the animal's cage. If you have a pet allergy, talk to your allergist about the potential for allergy immunotherapy (allergy shots). This strategy can often provide long-term relief.   Skin care recommendations  Bath time: Always use lukewarm water. AVOID very hot or cold water. Keep bathing time to 5-10 minutes. Do NOT use bubble bath. Use a mild soap and use just enough to wash the dirty areas. Do NOT scrub skin vigorously.  After bathing, pat dry your skin with a towel. Do NOT rub or scrub the skin.  Moisturizers and prescriptions:  ALWAYS apply moisturizers immediately after bathing (within 3 minutes). This helps to lock-in moisture. Use the moisturizer several times a day over the whole body. Good summer moisturizers include: Aveeno, CeraVe, Cetaphil. Good winter moisturizers include: Aquaphor, Vaseline, Cerave, Cetaphil, Eucerin, Vanicream. When using moisturizers along with medications, the moisturizer should be applied about one hour after applying the medication to prevent diluting effect of the medication or moisturize around where you  applied the medications. When not using medications, the moisturizer can be continued twice daily as maintenance.  Laundry and clothing: Avoid laundry products with added color or perfumes. Use unscented hypo-allergenic laundry products such as Tide free, Cheer free & gentle, and All free and clear.  If the skin still seems dry or sensitive, you can try double-rinsing the clothes. Avoid tight or scratchy clothing such as wool. Do not use fabric softeners or dyer sheets.

## 2022-07-15 NOTE — Assessment & Plan Note (Signed)
See below for proper skin care. Moisturize daily.

## 2022-07-15 NOTE — Assessment & Plan Note (Signed)
>>  ASSESSMENT AND PLAN FOR OTHER ALLERGIC RHINITIS WRITTEN ON 07/15/2022  1:06 PM BY Wyline Mood M, DO  Worsening rhinoconjunctivitis symptoms for the last 6 months.  Tried Flonase with no benefit.  Benadryl Claritin helps.  2024 blood work was positive dust mites and tree pollen.  No prior ENT evaluation. Today's skin testing showed: Positive to grass, ragweed, weed, trees, dust mites, dog. Start environmental control measures as below. Use over the counter antihistamines such as Zyrtec (cetirizine), Claritin (loratadine), Allegra (fexofenadine), or Xyzal (levocetirizine) daily as needed. May take twice a day during allergy flares. May switch antihistamines every few months. Start Singulair (montelukast) 10mg  daily at night. Cautioned that in some children/adults can experience behavioral changes including hyperactivity, agitation, depression, sleep disturbances and suicidal ideations. These side effects are rare, but if you notice them you should notify me and discontinue Singulair (montelukast). Use Flonase (fluticasone) nasal spray 1 spray per nostril twice a day as needed for nasal congestion.  Use azelastine nasal spray 1-2 sprays per nostril twice a day as needed for runny nose/drainage. Nasal saline spray (i.e., Simply Saline) or nasal saline lavage (i.e., NeilMed) is recommended as needed and prior to medicated nasal sprays. Consider allergy injections for long term control if above medications do not help the symptoms - handout given.  Declined eye drops.  Recommend annual eye exam.

## 2022-07-15 NOTE — Assessment & Plan Note (Signed)
Worsening rhinoconjunctivitis symptoms for the last 6 months.  Tried Flonase with no benefit.  Benadryl Claritin helps.  2024 blood work was positive dust mites and tree pollen.  No prior ENT evaluation. Today's skin testing showed: Positive to grass, ragweed, weed, trees, dust mites, dog. Start environmental control measures as below. Use over the counter antihistamines such as Zyrtec (cetirizine), Claritin (loratadine), Allegra (fexofenadine), or Xyzal (levocetirizine) daily as needed. May take twice a day during allergy flares. May switch antihistamines every few months. Start Singulair (montelukast) '10mg'$  daily at night. Cautioned that in some children/adults can experience behavioral changes including hyperactivity, agitation, depression, sleep disturbances and suicidal ideations. These side effects are rare, but if you notice them you should notify me and discontinue Singulair (montelukast). Use Flonase (fluticasone) nasal spray 1 spray per nostril twice a day as needed for nasal congestion.  Use azelastine nasal spray 1-2 sprays per nostril twice a day as needed for runny nose/drainage. Nasal saline spray (i.e., Simply Saline) or nasal saline lavage (i.e., NeilMed) is recommended as needed and prior to medicated nasal sprays. Consider allergy injections for long term control if above medications do not help the symptoms - handout given.  Declined eye drops.  Recommend annual eye exam.

## 2022-08-10 DIAGNOSIS — Z419 Encounter for procedure for purposes other than remedying health state, unspecified: Secondary | ICD-10-CM | POA: Diagnosis not present

## 2022-08-15 ENCOUNTER — Emergency Department (HOSPITAL_COMMUNITY)
Admission: EM | Admit: 2022-08-15 | Discharge: 2022-08-16 | Disposition: A | Discharge status: Home / Self Care | Payer: Medicaid Other | Attending: Emergency Medicine | Admitting: Emergency Medicine

## 2022-08-15 ENCOUNTER — Other Ambulatory Visit: Payer: Self-pay

## 2022-08-15 ENCOUNTER — Encounter (HOSPITAL_COMMUNITY): Payer: Self-pay

## 2022-08-15 DIAGNOSIS — R1030 Lower abdominal pain, unspecified: Secondary | ICD-10-CM | POA: Diagnosis present

## 2022-08-15 DIAGNOSIS — L739 Follicular disorder, unspecified: Secondary | ICD-10-CM | POA: Insufficient documentation

## 2022-08-15 NOTE — ED Triage Notes (Signed)
Pt to ED by POV from home with c/o vaginal swelling. Pt states she had a braz[place] wax yesterday and woke up this morning to extreme swelling and pain. Arrives A+O, VSS, NADN.

## 2022-08-16 MED ORDER — DOXYCYCLINE HYCLATE 100 MG PO TABS
100.0000 mg | ORAL_TABLET | Freq: Once | ORAL | Status: AC
  Administered 2022-08-16: 100 mg via ORAL
  Filled 2022-08-16: qty 1

## 2022-08-16 MED ORDER — DOXYCYCLINE HYCLATE 100 MG PO CAPS: 100.0000 mg | ORAL_CAPSULE | Freq: Two times a day (BID) | ORAL | 0 refills | Status: DC

## 2022-08-16 NOTE — ED Provider Notes (Signed)
[facility] Emergency Department Provider Note MRN:  [id number]  Arrival date & time: 08/16/22     Chief Complaint   Groin Swelling   History of Present Illness   Beth Goodwin is a 27 y.o. year-old female presents to the ED with chief complaint of suprapubic pain and swelling.  Patient states that she got a [place] wax 2 days ago.  States that she was fine yesterday, but today noticed increased swelling and pain.  She denies fevers or chills.  Denies any dysuria or vaginal discharge.  Denies having had this problem before.  Denies any other complaints.  History provided by patient.   Review of Systems  Pertinent positive and negative review of systems noted in HPI.    Physical Exam   Vitals:   08/15/22 2327  BP: 136/78  Pulse: 92  Resp: 20  Temp: 99.1 F (37.3 C)  SpO2: 100%    CONSTITUTIONAL:  well-appearing, NAD NEURO:  Alert and oriented x 3, CN 3-12 grossly intact EYES:  eyes equal and reactive ENT/NECK:  Supple, no stridor  CARDIO:  normal rate, appears well-perfused  PULM:  No respiratory distress,  GI/GU:  non-distended, RN chaperone present for exam, there is mild induration of the mons pubis concerning for developing infection, but without obvious abscess MSK/SPINE:  No gross deformities, no edema, moves all extremities  SKIN:  no rash, atraumatic   *Additional and/or pertinent findings included in MDM below  Diagnostic and Interventional Summary    EKG Interpretation  Date/Time:    Ventricular Rate:    PR Interval:    QRS Duration:   QT Interval:    QTC Calculation:   R Axis:     Text Interpretation:         Labs Reviewed - No data to display  No orders to display    Medications  doxycycline (VIBRA-TABS) tablet 100 mg (has no administration in time range)     Procedures  /  Critical Care Procedures  ED Course and Medical Decision Making  I have reviewed the triage vital signs, the nursing notes, and  pertinent available records from the EMR.  Social Determinants Affecting Complexity of Care: Patient has no clinically significant social determinants affecting this chief complaint..   ED Course:    Medical Decision Making Patient here with reported groin swelling.  She does have some swelling of the soft tissues of her mons pubis.  I am concerned about developing cellulitis.  I do not see any evidence of abscess.  Do not feel that incision and drainage or advanced imaging is needed.  Will start patient on doxycycline.  She states that she is not pregnant.  Recommend warm compresses.  Return precautions discussed.  Risk Prescription drug management.     Consultants: No consultations were needed in caring for this patient.   Treatment and Plan: Emergency department workup does not suggest an emergent condition requiring admission or immediate intervention beyond  what has been performed at this time. The patient is safe for discharge and has  been instructed to return immediately for worsening symptoms, change in  symptoms or any other concerns    Final Clinical Impressions(s) / ED Diagnoses     ICD-10-CM   1. Folliculitis  L73.9       ED Discharge Orders          Ordered    doxycycline (VIBRAMYCIN) 100 MG capsule  2 times daily        08/16/22  0225              Discharge Instructions Discussed with and Provided to Patient:   Discharge Instructions   None      Roxy Horseman, PA-C 08/16/22 0225    Sabas Sous, MD 08/17/22 [phone]

## 2022-09-01 ENCOUNTER — Encounter (HOSPITAL_COMMUNITY): Payer: Self-pay

## 2022-09-01 ENCOUNTER — Emergency Department (HOSPITAL_COMMUNITY)
Admission: EM | Admit: 2022-09-01 | Discharge: 2022-09-01 | Disposition: A | Discharge status: Home / Self Care | Payer: Medicaid Other | Attending: Emergency Medicine | Admitting: Emergency Medicine

## 2022-09-01 DIAGNOSIS — Z1152 Encounter for screening for COVID-19: Secondary | ICD-10-CM | POA: Diagnosis not present

## 2022-09-01 DIAGNOSIS — J029 Acute pharyngitis, unspecified: Secondary | ICD-10-CM | POA: Insufficient documentation

## 2022-09-01 LAB — SARS CORONAVIRUS 2 BY RT PCR: SARS Coronavirus 2 by RT PCR: NEGATIVE

## 2022-09-01 LAB — GROUP A STREP BY PCR: Group A Strep by PCR: NOT DETECTED

## 2022-09-01 NOTE — Discharge Instructions (Signed)
Your symptoms indicate a likely viral infection (Pharyngitis).   Pharyngitis is inflammation in the back of the throat which can cause a sore throat, scratchiness and sometimes difficulty swallowing.   Pharyngitis is typically caused by a respiratory virus and will just run its course.  Please keep in mind that your symptoms could last up to 10 days.  For throat pain, we recommend over the counter oral pain relief medications such as acetaminophen or aspirin, or anti-inflammatory medications such as ibuprofen or naproxen sodium.  Topical treatments such as oral throat lozenges or sprays may be used as needed.  Avoid close contact with loved ones, especially the very young and elderly.  Remember to wash your hands thoroughly throughout the day as this is the number one way to prevent the spread of infection and wipe down door knobs and counters with disinfectant.   Home Care: Only take medications as instructed by your medical team. Do not drink alcohol while taking these medications. A steam or ultrasonic humidifier can help congestion.  You can place a towel over your head and breathe in the steam from hot water coming from a faucet. Avoid close contacts especially the very young and the elderly. Cover your mouth when you cough or sneeze. Always remember to wash your hands.  Get Help Right Away If: You develop worsening fever or throat pain. You develop a severe head ache or visual changes. Your symptoms persist after you have completed your treatment plan.  Make sure you Understand these instructions. Will watch your condition. Will get help right away if you are not doing well or get worse.

## 2022-09-01 NOTE — ED Provider Notes (Signed)
[facility] Emergency Department Provider Note MRN:  [id number]  Arrival date & time: 09/01/22     Chief Complaint   Sore Throat   History of Present Illness   Beth Goodwin is a 27 y.o. year-old female presents to the ED with chief complaint of sore throat.  Onset after recent trip from the [place] a couple days ago.  She denies fever.  She has tried Engineer, production.  Denies any other symptoms.  History provided by patient.   Review of Systems  Pertinent positive and negative review of systems noted in HPI.    Physical Exam   Vitals:   09/01/22 0225  BP: 124/74  Pulse: 89  Resp: 20  Temp: 97.6 F (36.4 C)  SpO2: 100%    CONSTITUTIONAL:  well-appearing, NAD NEURO:  Alert and oriented x 3, CN 3-12 grossly intact EYES:  eyes equal and reactive ENT/NECK:  Supple, no stridor, mild erythema, no exudate or abscess, normal voice CARDIO:  normal rate, appears well-perfused  PULM:  No respiratory distress,  GI/GU:  non-distended,  MSK/SPINE:  No gross deformities, no edema, moves all extremities  SKIN:  no rash, atraumatic   *Additional and/or pertinent findings included in MDM below  Diagnostic and Interventional Summary    EKG Interpretation  Date/Time:    Ventricular Rate:    PR Interval:    QRS Duration:   QT Interval:    QTC Calculation:   R Axis:     Text Interpretation:         Labs Reviewed  SARS CORONAVIRUS 2 BY RT PCR  GROUP A STREP BY PCR    No orders to display    Medications - No data to display   Procedures  /  Critical Care Procedures  ED Course and Medical Decision Making  I have reviewed the triage vital signs, the nursing notes, and pertinent available records from the EMR.  Social Determinants Affecting Complexity of Care: Patient has no clinically significant social determinants affecting this chief complaint..   ED Course:    Medical Decision Making Pt afebrile without tonsillar exudate,  negative strep. Presents with mild cervical lymphadenopathy, & dysphagia; diagnosis of viral pharyngitis. No abx indicated. DC w symptomatic tx for pain  Pt does not appear dehydrated, but did discuss importance of water rehydration. Presentation non concerning for PTA or infxn spread to soft tissue. No trismus or uvula deviation. Specific return precautions discussed. Pt able to drink water in ED without difficulty with intact air way. Recommended PCP follow up.      Consultants: No consultations were needed in caring for this patient.   Treatment and Plan: Emergency department workup does not suggest an emergent condition requiring admission or immediate intervention beyond  what has been performed at this time. The patient is safe for discharge and has  been instructed to return immediately for worsening symptoms, change in  symptoms or any other concerns    Final Clinical Impressions(s) / ED Diagnoses     ICD-10-CM   1. Pharyngitis, unspecified etiology  J02.9       ED Discharge Orders     None         Discharge Instructions Discussed with and Provided to Patient:   Discharge Instructions      Your symptoms indicate a likely viral infection (Pharyngitis).   Pharyngitis is inflammation in the back of the throat which can cause a sore throat, scratchiness and sometimes difficulty swallowing.   Pharyngitis  is typically caused by a respiratory virus and will just run its course.  Please keep in mind that your symptoms could last up to 10 days.  For throat pain, we recommend over the counter oral pain relief medications such as acetaminophen or aspirin, or anti-inflammatory medications such as ibuprofen or naproxen sodium.  Topical treatments such as oral throat lozenges or sprays may be used as needed.  Avoid close contact with loved ones, especially the very young and elderly.  Remember to wash your hands thoroughly throughout the day as this is the number one way to prevent the  spread of infection and wipe down door knobs and counters with disinfectant.   Home Care: Only take medications as instructed by your medical team. Do not drink alcohol while taking these medications. A steam or ultrasonic humidifier can help congestion.  You can place a towel over your head and breathe in the steam from hot water coming from a faucet. Avoid close contacts especially the very young and the elderly. Cover your mouth when you cough or sneeze. Always remember to wash your hands.  Get Help Right Away If: You develop worsening fever or throat pain. You develop a severe head ache or visual changes. Your symptoms persist after you have completed your treatment plan.  Make sure you Understand these instructions. Will watch your condition. Will get help right away if you are not doing well or get worse.          Roxy Horseman, PA-C 09/01/22 4098    Marily Memos, MD 09/01/22 [phone]

## 2022-09-01 NOTE — ED Triage Notes (Signed)
Pt states that she just took a trip and since coming back has has a sore throat and nasal congestion without fevers.

## 2022-09-02 ENCOUNTER — Other Ambulatory Visit: Payer: Self-pay

## 2022-09-02 ENCOUNTER — Emergency Department (HOSPITAL_COMMUNITY)
Admission: EM | Admit: 2022-09-02 | Discharge: 2022-09-02 | Disposition: A | Discharge status: Home / Self Care | Payer: Medicaid Other | Attending: Emergency Medicine | Admitting: Emergency Medicine

## 2022-09-02 DIAGNOSIS — J029 Acute pharyngitis, unspecified: Secondary | ICD-10-CM | POA: Diagnosis not present

## 2022-09-02 DIAGNOSIS — R0989 Other specified symptoms and signs involving the circulatory and respiratory systems: Secondary | ICD-10-CM | POA: Insufficient documentation

## 2022-09-02 MED ORDER — CLINDAMYCIN HCL 300 MG PO CAPS: 300.0000 mg | ORAL_CAPSULE | Freq: Three times a day (TID) | ORAL | 0 refills | Status: AC

## 2022-09-02 NOTE — ED Notes (Signed)
[place], PA at bedside 

## 2022-09-02 NOTE — Discharge Instructions (Signed)
Take medication as prescribed  Return for new or worsening symptoms 

## 2022-09-02 NOTE — ED Provider Notes (Signed)
[place] EMERGENCY DEPARTMENT AT [facility] Provider Note   CSN: [id number] Arrival date & time: 09/02/22  2234    History  Chief Complaint  Patient presents with   Sore Throat    Beth Goodwin is a 27 y.o. female with no significant past medical history here for evaluation of sore throat. Began 5 days ago. Worsening. Pain with swallowing. No change in voice, neck pain, stiffness. Some chills without documented fever. Recently returned back from [place]. Unknown sick contacts. No HA, CP, SOB, abd pain. Tried Theraflu and Mucinex. Some congestion and rhinorrhea. No cough.  Seen yesterday Neg strep, COVID, Flu  HPI     Home Medications Prior to Admission medications   Medication Sig Start Date End Date Taking? Authorizing Provider  clindamycin (CLEOCIN) 300 MG capsule Take 1 capsule (300 mg total) by mouth 3 (three) times daily for 7 days. 09/02/22 09/09/22 Yes Letta Cargile A, PA-C  azelastine (ASTELIN) 0.1 % nasal spray Place 1-2 sprays into both nostrils 2 (two) times daily as needed (nasal drainage). Use in each nostril as directed 07/15/22   Ellamae Sia, DO  doxycycline (VIBRAMYCIN) 100 MG capsule Take 1 capsule (100 mg total) by mouth 2 (two) times daily. 08/16/22   Roxy Horseman, PA-C  etonogestrel (NEXPLANON) 68 MG IMPL implant Inject into the skin.    [provider]  fluticasone (FLONASE) 50 MCG/ACT nasal spray Place 2 sprays into both nostrils daily. 05/21/22   Carlean Jews, NP  levocetirizine (XYZAL) 5 MG tablet Take 1 tablet (5 mg total) by mouth every evening. 07/15/22   Ellamae Sia, DO  montelukast (SINGULAIR) 10 MG tablet Take 1 tablet (10 mg total) by mouth at bedtime. 07/15/22   Ellamae Sia, DO  Vitamin D, Ergocalciferol, (DRISDOL) 1.25 MG (50000 UNIT) CAPS capsule Take 50,000 Units by mouth every 7 (seven) days.    [provider]  dicyclomine (BENTYL) 20 MG tablet Take 1 tablet (20 mg total) by mouth 2 (two) times daily. Patient  not taking: Reported on 11/28/2016 05/23/16 04/14/19  Marlon Pel, PA-C      Allergies    Amoxicillin    Review of Systems   Review of Systems  Constitutional: Negative.   HENT:  Positive for rhinorrhea and sore throat. Negative for congestion, ear discharge, ear pain, facial swelling, sinus pressure, sinus pain, sneezing, trouble swallowing and voice change.   Respiratory: Negative.    Cardiovascular: Negative.   Gastrointestinal: Negative.   Genitourinary: Negative.   Musculoskeletal: Negative.   Skin: Negative.   Neurological: Negative.   All other systems reviewed and are negative.   Physical Exam Updated Vital Signs BP (!) 134/97 (BP Location: Left Arm)   Pulse 86   Temp 98.8 F (37.1 C) (Oral)   Resp 19   Ht  (1.626 m)   Wt 124.7 kg   LMP 07/23/2022 (Approximate)   SpO2 93%   BMI 47.20 kg/m  Physical Exam Vitals and nursing note reviewed.  Constitutional:      General: She is not in acute distress.    Appearance: She is well-developed. She is not ill-appearing, toxic-appearing or diaphoretic.  HENT:     Head: Normocephalic and atraumatic.     Nose: Rhinorrhea (clear) present.     Mouth/Throat:     Mouth: Mucous membranes are moist.     Pharynx: Uvula midline. Oropharyngeal exudate and posterior oropharyngeal erythema present. No pharyngeal swelling or uvula swelling.     Tonsils:  Tonsillar exudate present. No tonsillar abscesses. 2+ on the right. 2+ on the left.     Comments: Tonsil 2 + BIL with erythema and exudate. No pooling of secretions. Eyes:     Pupils: Pupils are equal, round, and reactive to light.  Neck:     Comments: Full ROM Cardiovascular:     Rate and Rhythm: Normal rate.     Heart sounds: Normal heart sounds.  Pulmonary:     Effort: Pulmonary effort is normal. No respiratory distress.     Breath sounds: Normal breath sounds.  Abdominal:     General: Bowel sounds are normal. There is no distension.     Palpations: Abdomen is soft.   Musculoskeletal:        General: Normal range of motion.     Cervical back: Normal range of motion.  Skin:    General: Skin is warm and dry.  Neurological:     General: No focal deficit present.     Mental Status: She is alert.  Psychiatric:        Mood and Affect: Mood normal.     ED Results / Procedures / Treatments   Labs (all labs ordered are listed, but only abnormal results are displayed) Labs Reviewed - No data to display  EKG None  Radiology No results found.  Procedures Procedures    Medications Ordered in ED Medications - No data to display  ED Course/ Medical Decision Making/ A&P    27 year old here for sore throat.  Seen yesterday for similar.  She is afebrile, nonseptic, not ill-appearing.  Uvula midline.  No pooling of secretions.  She does have 2+ tonsils bilateral with erythema and exudate.  Some cervical lymphadenopathy.  No neck stiffness or neck rigidity, no meningismus.  Heart and lungs clear. No trismus. Low suspicion for PTA, RPA. No facial swelling. She is tolerating p.o. intake.  No suspicion for mononucleosis.  Will treat for pharyngitis.  The patient has been appropriately medically screened and/or stabilized in the ED. I have low suspicion for any other emergent medical condition which would require further screening, evaluation or treatment in the ED or require inpatient management.  Patient is hemodynamically stable and in no acute distress.  Patient able to ambulate in department prior to ED.  Evaluation does not show acute pathology that would require ongoing or additional emergent interventions while in the emergency department or further inpatient treatment.  I have discussed the diagnosis with the patient and answered all questions.  Pain is been managed while in the emergency department and patient has no further complaints prior to discharge.  Patient is comfortable with plan discussed in room and is stable for discharge at this time.  I  have discussed strict return precautions for returning to the emergency department.  Patient was encouraged to follow-up with PCP/specialist refer to at discharge.                              Medical Decision Making Amount and/or Complexity of Data Reviewed External Data Reviewed: labs, radiology and notes.  Risk OTC drugs. Prescription drug management. Diagnosis or treatment significantly limited by social determinants of health.          Final Clinical Impression(s) / ED Diagnoses Final diagnoses:  Pharyngitis, unspecified etiology    Rx / DC Orders ED Discharge Orders          Ordered    clindamycin (CLEOCIN) 300 MG  capsule  3 times daily        09/02/22 2325              Bracha Frankowski A, PA-C 09/02/22 2325    Arby Barrette, MD 09/03/22 1544

## 2022-09-02 NOTE — ED Notes (Signed)
Pt A&Ox4 ambulatory at d/c with independent steady gait. Pt verbalized understanding of d/c instructions, prescription and follow up care. 

## 2022-09-02 NOTE — ED Triage Notes (Signed)
Pt arrives c/o sore throat, painful swallowing, muffled voice, bilateral ear pain worsening since Sunday. Recently got back from [place]. Denies fevers. Took ibuprofen earlier this afternoon and has been using other home remedies for symptom relief.

## 2022-09-09 DIAGNOSIS — Z419 Encounter for procedure for purposes other than remedying health state, unspecified: Secondary | ICD-10-CM | POA: Diagnosis not present

## 2022-09-30 ENCOUNTER — Encounter: Payer: Self-pay | Admitting: Family Medicine

## 2022-09-30 ENCOUNTER — Ambulatory Visit (INDEPENDENT_AMBULATORY_CARE_PROVIDER_SITE_OTHER): Payer: Medicaid Other | Admitting: Family Medicine

## 2022-09-30 VITALS — BP 114/79 | HR 89 | Resp 18 | Ht 64.0 in | Wt 279.0 lb

## 2022-09-30 DIAGNOSIS — L732 Hidradenitis suppurativa: Secondary | ICD-10-CM | POA: Diagnosis not present

## 2022-09-30 DIAGNOSIS — Z113 Encounter for screening for infections with a predominantly sexual mode of transmission: Secondary | ICD-10-CM | POA: Diagnosis not present

## 2022-09-30 DIAGNOSIS — R61 Generalized hyperhidrosis: Secondary | ICD-10-CM | POA: Diagnosis not present

## 2022-09-30 NOTE — Progress Notes (Signed)
Acute Office Visit  Subjective:     Patient ID: Beth Goodwin, female    DOB: 1995-07-27, 27 y.o.   MRN: [id number]  Chief Complaint  Patient presents with   Rash   screen for STIs    HPI Patient is in today for STI screenings.  She and her boyfriend were both recently treated with a course of doxycycline for a bacterial infection, so she would like to be screened for all STIs. Additionally, she has a mass in her left axilla that has been bothersome.  It is tender at times and has recurred several times in the past.  She has switched deodorants to see if that would help, but it still came back.  In the past, the spot was tender and seemed to ooze whereas this time it is tender and firm without any opening.  She endorses heavy sweating all her life, she easily sweats through her shirts sometimes even when wearing deodorant.  ROS General: Denies fever, chills, fatigue Skin: See HPI Cardio/pulm: Denies chest pain, cough, shortness of breath GU: Denies abdominal pain, dysuria, urgency, frequency, vaginal discharge, vaginal bleeding, pruritus     Objective:    BP 114/79 (BP Location: Left Arm, Patient Position: Sitting, Cuff Size: Large)   Pulse 89   Resp 18   Ht 5\' 4"  (1.626 m)   Wt 279 lb (126.6 kg)   LMP 07/23/2022 (Approximate)   SpO2 99%   BMI 47.89 kg/m   Physical Exam Constitutional:      General: She is not in acute distress.    Appearance: Normal appearance.  HENT:     Head: Normocephalic and atraumatic.  Cardiovascular:     Rate and Rhythm: Normal rate and regular rhythm.     Heart sounds: No murmur heard.    No friction rub. No gallop.  Pulmonary:     Effort: Pulmonary effort is normal. No respiratory distress.     Breath sounds: No wheezing, rhonchi or rales.  Skin:    General: Skin is warm and dry.     Findings: Rash present.     Comments: Nontender firm 1.5 x 3 cm nodule with minor scarring in left axilla, no drainage or open wound  Neurological:      Mental Status: She is alert and oriented to person, place, and time.      Assessment & Plan:  Routine screening for STI (sexually transmitted infection) -     NuSwab Vaginitis Plus (VG+) -     RPR; Future -     HSV 1 and 2 Ab, IgG; Future -     HIV Antibody (routine testing w rflx); Future  Hidradenitis suppurativa of left axilla Assessment & Plan: History and physical exam consistent with hidradenitis suppurativa.  Recommend continuing with warm compresses and ibuprofen as needed for pain/tenderness.  Encouraged to continue with good skin hygiene and to consider switching deodorants if she may be less sensitive to a different brand.  Starting with course of doxycycline 100 mg daily unless STI screening warrants coverage for additional infection.  Will continue doxycycline until follow-up appointment in about 3 months and assess for efficacy.  We may consider trying metformin or an antiandrogenic medication like spironolactone, OCP, or finasteride in the future.  This may be beneficial as she also has a history of prediabetes and risk factors for PCOS.  We will discuss this further at her follow-up appointment.  Orders: -     Ambulatory referral to Dermatology  Hyperhidrosis  Assessment & Plan: Continue with good skin hygiene and using deodorant.  I also advised her that it may be worthwhile to call her insurance to see what treatment options they will cover before she has her dermatology appointment.  Orders: -     Ambulatory referral to Dermatology    Return in about 4 months (around 01/31/2023) for follow-up for HS.  Melida Quitter, PA

## 2022-09-30 NOTE — Assessment & Plan Note (Signed)
Continue with good skin hygiene and using deodorant.  I also advised her that it may be worthwhile to call her insurance to see what treatment options they will cover before she has her dermatology appointment.

## 2022-09-30 NOTE — Assessment & Plan Note (Addendum)
History and physical exam consistent with hidradenitis suppurativa.  Recommend continuing with warm compresses and ibuprofen as needed for pain/tenderness.  Encouraged to continue with good skin hygiene and to consider switching deodorants if she may be less sensitive to a different brand.  Starting with course of doxycycline 100 mg daily unless STI screening warrants coverage for additional infection.  Will continue doxycycline until follow-up appointment in about 3 months and assess for efficacy.  We may consider trying metformin or an antiandrogenic medication like spironolactone, OCP, or finasteride in the future.  This may be beneficial as she also has a history of prediabetes and risk factors for PCOS.  We will discuss this further at her follow-up appointment.

## 2022-09-30 NOTE — Patient Instructions (Signed)
Continue with warm compresses for the spot on your underarm.  It may also be worth trying a different deodorant to see if you are less sensitive to a different type.  If it does become painful, ibuprofen is fantastic for pain relief.  As long as your screening is negative and we do not need to cover for anything else, we will start doxycycline for the next few months at a low-dose to see if it can get that spot to resolve and keep it from coming back.  At that point, we can talk about options to continue to keep it from coming back.  I will send you a message once I have a chance to look at your lab results in MyChart!

## 2022-10-01 ENCOUNTER — Ambulatory Visit: Payer: Medicaid Other | Admitting: Family Medicine

## 2022-10-01 LAB — HIV ANTIBODY (ROUTINE TESTING W REFLEX): HIV Screen 4th Generation wRfx: NONREACTIVE

## 2022-10-01 LAB — HSV 1 AND 2 AB, IGG
HSV 1 Glycoprotein G Ab, IgG: <0.91 index (ref 0.00–0.90)
HSV 2 IgG, Type Spec: 11.9 index — ABNORMAL HIGH (ref 0.00–0.90)

## 2022-10-01 LAB — RPR: RPR Ser Ql: NONREACTIVE

## 2022-10-02 ENCOUNTER — Encounter: Payer: Self-pay | Admitting: Family Medicine

## 2022-10-02 LAB — NUSWAB VAGINITIS PLUS (VG+)
Atopobium vaginae: HIGH Score — AB
BVAB 2: HIGH Score — AB
Candida albicans, NAA: NEGATIVE
Candida glabrata, NAA: NEGATIVE
Chlamydia trachomatis, NAA: NEGATIVE
Megasphaera 1: HIGH Score — AB
Neisseria gonorrhoeae, NAA: NEGATIVE
Trich vag by NAA: NEGATIVE

## 2022-10-06 ENCOUNTER — Other Ambulatory Visit: Payer: Self-pay | Admitting: Family Medicine

## 2022-10-06 DIAGNOSIS — A599 Trichomoniasis, unspecified: Secondary | ICD-10-CM

## 2022-10-06 MED ORDER — METRONIDAZOLE 500 MG PO TABS: 2000.0000 mg | ORAL_TABLET | Freq: Once | ORAL | 0 refills | Status: AC

## 2022-10-06 MED ORDER — METRONIDAZOLE 500 MG PO TABS
500.0000 mg | ORAL_TABLET | Freq: Two times a day (BID) | ORAL | 0 refills | Status: AC
Start: 2022-10-06 — End: 2022-10-13

## 2022-10-10 DIAGNOSIS — Z419 Encounter for procedure for purposes other than remedying health state, unspecified: Secondary | ICD-10-CM | POA: Diagnosis not present

## 2022-10-16 ENCOUNTER — Ambulatory Visit: Payer: Medicaid Other | Admitting: Nurse Practitioner

## 2022-11-04 ENCOUNTER — Ambulatory Visit (INDEPENDENT_AMBULATORY_CARE_PROVIDER_SITE_OTHER): Payer: Medicaid Other | Admitting: Allergy

## 2022-11-04 ENCOUNTER — Encounter: Payer: Self-pay | Admitting: Allergy

## 2022-11-04 VITALS — BP 114/70 | HR 95 | Temp 98.0°F | Resp 16

## 2022-11-04 DIAGNOSIS — Z880 Allergy status to penicillin: Secondary | ICD-10-CM | POA: Diagnosis not present

## 2022-11-04 DIAGNOSIS — J3089 Other allergic rhinitis: Secondary | ICD-10-CM

## 2022-11-04 DIAGNOSIS — H1010 Acute atopic conjunctivitis, unspecified eye: Secondary | ICD-10-CM

## 2022-11-04 DIAGNOSIS — L2089 Other atopic dermatitis: Secondary | ICD-10-CM | POA: Diagnosis not present

## 2022-11-04 DIAGNOSIS — H1013 Acute atopic conjunctivitis, bilateral: Secondary | ICD-10-CM | POA: Diagnosis not present

## 2022-11-04 DIAGNOSIS — J302 Other seasonal allergic rhinitis: Secondary | ICD-10-CM

## 2022-11-04 MED ORDER — TRIAMCINOLONE ACETONIDE 0.1 % EX OINT: 1.0000 | TOPICAL_OINTMENT | Freq: Two times a day (BID) | CUTANEOUS | 2 refills | Status: AC | PRN

## 2022-11-04 MED ORDER — LEVOCETIRIZINE DIHYDROCHLORIDE 5 MG PO TABS
5.0000 mg | ORAL_TABLET | Freq: Every evening | ORAL | 2 refills | Status: DC
Start: 2022-11-04 — End: 2024-03-01

## 2022-11-04 MED ORDER — MONTELUKAST SODIUM 10 MG PO TABS
10.0000 mg | ORAL_TABLET | Freq: Every day | ORAL | 2 refills | Status: DC
Start: 2022-11-04 — End: 2024-03-01

## 2022-11-04 NOTE — Patient Instructions (Addendum)
Environmental allergies 2024 skin testing showed: Positive to grass, ragweed, weed, trees, dust mites, dog. 2024 bloodwork was positive to dust mites, tree pollen. Continue environmental control measures as below.  Use over the counter antihistamines such as Zyrtec (cetirizine), Claritin (loratadine), Allegra (fexofenadine), or Xyzal (levocetirizine) daily as needed. May take twice a day during allergy flares. May switch antihistamines every few months. Continue Singulair (montelukast) 10mg  daily at night. Use Flonase (fluticasone) nasal spray 1 spray per nostril twice a day as needed for nasal congestion.  Use azelastine nasal spray 1-2 sprays per nostril twice a day as needed for runny nose/drainage. Nasal saline spray (i.e., Simply Saline) or nasal saline lavage (i.e., NeilMed) is recommended as needed and prior to medicated nasal sprays. Consider allergy injections for long term control if above medications do not help the symptoms.  Skin Continue proper skin care. Use triamcinolone 0.1% ointment twice a day as needed for rash flares. Do not use on the face, neck, armpits or groin area. Do not use more than 3 weeks in a row.   Follow up in 12 months or sooner if needed.    Control of House Dust Mite Allergen Dust mite allergens are a common trigger of allergy and asthma symptoms. While they can be found throughout the house, these microscopic creatures thrive in warm, humid environments such as bedding, upholstered furniture and carpeting. Because so much time is spent in the bedroom, it is essential to reduce mite levels there.  Encase pillows, mattresses, and box springs in special allergen-proof fabric covers or airtight, zippered plastic covers.  Bedding should be washed weekly in hot water (130 F) and dried in a hot dryer. Allergen-proof covers are available for comforters and pillows that can't be regularly washed.  Wash the allergy-proof covers every few months. Minimize clutter in  the bedroom. Keep pets out of the bedroom.  Keep humidity less than 50% by using a dehumidifier or air conditioning. You can buy a humidity measuring device called a hygrometer to monitor this.  If possible, replace carpets with hardwood, linoleum, or washable area rugs. If that's not possible, vacuum frequently with a vacuum that has a HEPA filter. Remove all upholstered furniture and non-washable window drapes from the bedroom. Remove all non-washable stuffed toys from the bedroom.  Wash stuffed toys weekly. Reducing Pollen Exposure Pollen seasons: trees (spring), grass (summer) and ragweed/weeds (fall). Keep windows closed in your home and car to lower pollen exposure.  Install air conditioning in the bedroom and throughout the house if possible.  Avoid going out in dry windy days - especially early morning. Pollen counts are highest between 5 - 10 AM and on dry, hot and windy days.  Save outside activities for late afternoon or after a heavy rain, when pollen levels are lower.  Avoid mowing of grass if you have grass pollen allergy. Be aware that pollen can also be transported indoors on people and pets.  Dry your clothes in an automatic dryer rather than hanging them outside where they might collect pollen.  Rinse hair and eyes before bedtime. Pet Allergen Avoidance: Contrary to popular opinion, there are no "hypoallergenic" breeds of dogs or cats. That is because people are not allergic to an animal's hair, but to an allergen found in the animal's saliva, dander (dead skin flakes) or urine. Pet allergy symptoms typically occur within minutes. For some people, symptoms can build up and become most severe 8 to 12 hours after contact with the animal. People with severe allergies  can experience reactions in public places if dander has been transported on the pet owners' clothing. Keeping an animal outdoors is only a partial solution, since homes with pets in the yard still have higher  concentrations of animal allergens. Before getting a pet, ask your allergist to determine if you are allergic to animals. If your pet is already considered part of your family, try to minimize contact and keep the pet out of the bedroom and other rooms where you spend a great deal of time. As with dust mites, vacuum carpets often or replace carpet with a hardwood floor, tile or linoleum. High-efficiency particulate air (HEPA) cleaners can reduce allergen levels over time. While dander and saliva are the source of cat and dog allergens, urine is the source of allergens from rabbits, hamsters, mice and [place] pigs; so ask a non-allergic family member to clean the animal's cage. If you have a pet allergy, talk to your allergist about the potential for allergy immunotherapy (allergy shots). This strategy can often provide long-term relief.   Skin care recommendations  Bath time: Always use lukewarm water. AVOID very hot or cold water. Keep bathing time to 5-10 minutes. Do NOT use bubble bath. Use a mild soap and use just enough to wash the dirty areas. Do NOT scrub skin vigorously.  After bathing, pat dry your skin with a towel. Do NOT rub or scrub the skin.  Moisturizers and prescriptions:  ALWAYS apply moisturizers immediately after bathing (within 3 minutes). This helps to lock-in moisture. Use the moisturizer several times a day over the whole body. Good summer moisturizers include: Aveeno, CeraVe, Cetaphil. Good winter moisturizers include: Aquaphor, Vaseline, Cerave, Cetaphil, Eucerin, Vanicream. When using moisturizers along with medications, the moisturizer should be applied about one hour after applying the medication to prevent diluting effect of the medication or moisturize around where you applied the medications. When not using medications, the moisturizer can be continued twice daily as maintenance.  Laundry and clothing: Avoid laundry products with added color or perfumes. Use  unscented hypo-allergenic laundry products such as Tide free, Cheer free & gentle, and All free and clear.  If the skin still seems dry or sensitive, you can try double-rinsing the clothes. Avoid tight or scratchy clothing such as wool. Do not use fabric softeners or dyer sheets.

## 2022-11-04 NOTE — Assessment & Plan Note (Signed)
   Continue proper skin care. . Use triamcinolone 0.1% ointment twice a day as needed for rash flares. Do not use on the face, neck, armpits or groin area. Do not use more than 3 weeks in a row.  

## 2022-11-04 NOTE — Assessment & Plan Note (Signed)
Past history - worsening rhinoconjunctivitis symptoms for the last 6 months. 2024 blood work was positive dust mites and tree pollen.  No prior ENT evaluation. 2024 skin testing showed: Positive to grass, ragweed, weed, trees, dust mites, dog. Interim history - well controlled with Singulair and Xyzal. Continue environmental control measures as below. Use over the counter antihistamines such as Zyrtec (cetirizine), Claritin (loratadine), Allegra (fexofenadine), or Xyzal (levocetirizine) daily as needed. May take twice a day during allergy flares. May switch antihistamines every few months. Continue Singulair (montelukast) 10mg  daily at night. Use Flonase (fluticasone) nasal spray 1 spray per nostril twice a day as needed for nasal congestion.  Use azelastine nasal spray 1-2 sprays per nostril twice a day as needed for runny nose/drainage. Nasal saline spray (i.e., Simply Saline) or nasal saline lavage (i.e., NeilMed) is recommended as needed and prior to medicated nasal sprays. Consider allergy injections for long term control if above medications do not help the symptoms.

## 2022-11-04 NOTE — Progress Notes (Signed)
Follow Up Note  RE: Beth Goodwin MRN: [id number] DOB: 1995-05-22 Date of Office Visit: 11/04/2022  Referring provider: Carlean Jews, NP Primary care provider: Carlean Jews, NP  Chief Complaint: Allergies (Everything is going well.)  History of Present Illness: I had the pleasure of seeing Beth Goodwin for a follow up visit at the [facility] on 11/04/2022. She is a 27 y.o. female, who is being followed for allergic rhinoconjunctivitis, xerosis, penicillin allergy. Her previous allergy office visit was on 07/15/2022 with Dr. Selena Batten. Today is a regular follow up visit.  Environmental allergies Currently taking Xyzal and Singulair daily with good benefit. Symptomatic if misses a dose.   Did not need to use any nasal sprays.  Content with above regimen.  Skin Improved with daily moisturization. Does have a rough patch on the right lower back.  Assessment and Plan: Beth Goodwin is a 27 y.o. female with: Seasonal and perennial allergic rhinoconjunctivitis Past history - worsening rhinoconjunctivitis symptoms for the last 6 months. 2024 blood work was positive dust mites and tree pollen.  No prior ENT evaluation. 2024 skin testing showed: Positive to grass, ragweed, weed, trees, dust mites, dog. Interim history - well controlled with Singulair and Xyzal. Continue environmental control measures as below. Use over the counter antihistamines such as Zyrtec (cetirizine), Claritin (loratadine), Allegra (fexofenadine), or Xyzal (levocetirizine) daily as needed. May take twice a day during allergy flares. May switch antihistamines every few months. Continue Singulair (montelukast) 10mg  daily at night. Use Flonase (fluticasone) nasal spray 1 spray per nostril twice a day as needed for nasal congestion.  Use azelastine nasal spray 1-2 sprays per nostril twice a day as needed for runny nose/drainage. Nasal saline spray (i.e., Simply Saline) or nasal saline lavage  (i.e., NeilMed) is recommended as needed and prior to medicated nasal sprays. Consider allergy injections for long term control if above medications do not help the symptoms.  Other atopic dermatitis Continue proper skin care. Use triamcinolone 0.1% ointment twice a day as needed for rash flares. Do not use on the face, neck, armpits or groin area. Do not use more than 3 weeks in a row.   Penicillin allergy Past history - Amoxicillin caused hives on 2 separate occasions. Continue avoidance for now.  Return in about 1 year (around 11/04/2023).  Meds ordered this encounter  Medications   triamcinolone ointment (KENALOG) 0.1 %    Sig: Apply 1 Application topically 2 (two) times daily as needed (rash flare). Do not use on the face, neck, armpits or groin area. Do not use more than 3 weeks in a row.    Dispense:  30 g    Refill:  2   montelukast (SINGULAIR) 10 MG tablet    Sig: Take 1 tablet (10 mg total) by mouth at bedtime.    Dispense:  90 tablet    Refill:  2   levocetirizine (XYZAL) 5 MG tablet    Sig: Take 1 tablet (5 mg total) by mouth every evening.    Dispense:  90 tablet    Refill:  2   Lab Orders  No laboratory test(s) ordered today    Diagnostics: None.   Medication List:  Current Outpatient Medications  Medication Sig Dispense Refill   etonogestrel (NEXPLANON) 68 MG IMPL implant Inject into the skin.     levocetirizine (XYZAL) 5 MG tablet Take 1 tablet (5 mg total) by mouth every evening. 90 tablet 2   montelukast (SINGULAIR) 10 MG tablet  Take 1 tablet (10 mg total) by mouth at bedtime. 90 tablet 2   triamcinolone ointment (KENALOG) 0.1 % Apply 1 Application topically 2 (two) times daily as needed (rash flare). Do not use on the face, neck, armpits or groin area. Do not use more than 3 weeks in a row. 30 g 2   Vitamin D, Ergocalciferol, (DRISDOL) 1.25 MG (50000 UNIT) CAPS capsule Take 50,000 Units by mouth every 7 (seven) days.     No current facility-administered  medications for this visit.   Allergies: Allergies  Allergen Reactions   Amoxicillin Hives    Has patient had a PCN reaction causing immediate rash, facial/tongue/throat swelling, SOB or lightheadedness with hypotension: Yes Has patient had a PCN reaction causing severe rash involving mucus membranes or skin necrosis: No Has patient had a PCN reaction that required hospitalization: No Has patient had a PCN reaction occurring within the last 10 years: No If all of the above answers are "NO", then may proceed with Cephalosporin use.    I reviewed her past medical history, social history, family history, and environmental history and no significant changes have been reported from her previous visit.  Review of Systems  Constitutional:  Negative for appetite change, chills, fever and unexpected weight change.  HENT:  Negative for congestion, rhinorrhea and sneezing.   Eyes:  Negative for itching.  Respiratory:  Negative for cough, chest tightness, shortness of breath and wheezing.   Cardiovascular:  Negative for chest pain.  Gastrointestinal:  Negative for abdominal pain.  Genitourinary:  Negative for difficulty urinating.  Skin:  Positive for rash.  Allergic/Immunologic: Positive for environmental allergies. Negative for food allergies.    Objective: BP 114/70 (BP Location: Left Arm, Patient Position: Sitting, Cuff Size: Large)   Pulse 95   Temp 98 F (36.7 C) (Temporal)   Resp 16   SpO2 100%  There is no height or weight on file to calculate BMI. Physical Exam Vitals and nursing note reviewed.  Constitutional:      Appearance: Normal appearance. She is well-developed.  HENT:     Head: Normocephalic and atraumatic.     Right Ear: Tympanic membrane and external ear normal.     Left Ear: Tympanic membrane and external ear normal.     Nose:     Comments: Transverse nasal crease.    Mouth/Throat:     Mouth: Mucous membranes are moist.     Pharynx: Oropharynx is clear.  Eyes:      Conjunctiva/sclera: Conjunctivae normal.  Cardiovascular:     Rate and Rhythm: Normal rate and regular rhythm.     Heart sounds: Normal heart sounds. No murmur heard.    No friction rub. No gallop.  Pulmonary:     Effort: Pulmonary effort is normal.     Breath sounds: Normal breath sounds. No wheezing, rhonchi or rales.  Musculoskeletal:     Cervical back: Neck supple.  Skin:    General: Skin is warm.     Findings: Rash present.     Comments: Eczema spot on right lower back.  Neurological:     Mental Status: She is alert and oriented to person, place, and time.  Psychiatric:        Behavior: Behavior normal.    Previous notes and tests were reviewed. The plan was reviewed with the patient/family, and all questions/concerned were addressed.  It was my pleasure to see Karin today and participate in her care. Please feel free to contact me with any questions  or concerns.  Sincerely,  Kesa Birky, DO Allergy & Immunology  [facility] [place] office: [phone] [place] office: [phone] 

## 2022-11-04 NOTE — Assessment & Plan Note (Signed)
Past history - Amoxicillin caused hives on 2 separate occasions. Continue avoidance for now.

## 2022-11-09 DIAGNOSIS — Z419 Encounter for procedure for purposes other than remedying health state, unspecified: Secondary | ICD-10-CM | POA: Diagnosis not present

## 2022-12-10 DIAGNOSIS — Z419 Encounter for procedure for purposes other than remedying health state, unspecified: Secondary | ICD-10-CM | POA: Diagnosis not present

## 2023-01-10 DIAGNOSIS — Z419 Encounter for procedure for purposes other than remedying health state, unspecified: Secondary | ICD-10-CM | POA: Diagnosis not present

## 2023-02-04 ENCOUNTER — Other Ambulatory Visit: Payer: Self-pay

## 2023-02-04 DIAGNOSIS — Z0000 Encounter for general adult medical examination without abnormal findings: Secondary | ICD-10-CM

## 2023-02-09 DIAGNOSIS — Z419 Encounter for procedure for purposes other than remedying health state, unspecified: Secondary | ICD-10-CM | POA: Diagnosis not present

## 2023-02-11 ENCOUNTER — Other Ambulatory Visit: Payer: Medicaid Other

## 2023-02-11 DIAGNOSIS — R5383 Other fatigue: Secondary | ICD-10-CM | POA: Diagnosis not present

## 2023-02-11 DIAGNOSIS — Z0000 Encounter for general adult medical examination without abnormal findings: Secondary | ICD-10-CM

## 2023-02-12 LAB — CBC WITH DIFFERENTIAL/PLATELET
Basophils Absolute: 0 10*3/uL (ref 0.0–0.2)
Basos: 0 %
EOS (ABSOLUTE): 0.2 10*3/uL (ref 0.0–0.4)
Eos: 1 %
Hematocrit: 43.9 % (ref 34.0–46.6)
Hemoglobin: 13.6 g/dL (ref 11.1–15.9)
Immature Grans (Abs): 0 10*3/uL (ref 0.0–0.1)
Immature Granulocytes: 0 %
Lymphocytes Absolute: 3.3 10*3/uL — ABNORMAL HIGH (ref 0.7–3.1)
Lymphs: 31 %
MCH: 27.9 pg (ref 26.6–33.0)
MCHC: 31 g/dL — ABNORMAL LOW (ref 31.5–35.7)
MCV: 90 fL (ref 79–97)
Monocytes Absolute: 0.5 10*3/uL (ref 0.1–0.9)
Monocytes: 5 %
Neutrophils Absolute: 6.4 10*3/uL (ref 1.4–7.0)
Neutrophils: 63 %
Platelets: 360 10*3/uL (ref 150–450)
RBC: 4.87 x10E6/uL (ref 3.77–5.28)
RDW: 12.9 % (ref 11.7–15.4)
WBC: 10.4 10*3/uL (ref 3.4–10.8)

## 2023-02-12 LAB — HEMOGLOBIN A1C
Est. average glucose Bld gHb Est-mCnc: 146 mg/dL
Hgb A1c MFr Bld: 6.7 % — ABNORMAL HIGH (ref 4.8–5.6)

## 2023-02-12 LAB — COMPREHENSIVE METABOLIC PANEL
ALT: 36 [IU]/L — ABNORMAL HIGH (ref 0–32)
AST: 28 [IU]/L (ref 0–40)
Albumin: 4 g/dL (ref 4.0–5.0)
Alkaline Phosphatase: 83 [IU]/L (ref 44–121)
BUN/Creatinine Ratio: 14 (ref 9–23)
BUN: 9 mg/dL (ref 6–20)
Bilirubin Total: 0.4 mg/dL (ref 0.0–1.2)
CO2: 23 mmol/L (ref 20–29)
Calcium: 9.3 mg/dL (ref 8.7–10.2)
Chloride: 104 mmol/L (ref 96–106)
Creatinine, Ser: 0.66 mg/dL (ref 0.57–1.00)
Globulin, Total: 2.9 g/dL (ref 1.5–4.5)
Glucose: 115 mg/dL — ABNORMAL HIGH (ref 70–99)
Potassium: 4.9 mmol/L (ref 3.5–5.2)
Sodium: 141 mmol/L (ref 134–144)
Total Protein: 6.9 g/dL (ref 6.0–8.5)
eGFR: 125 mL/min/{1.73_m2} (ref >59)

## 2023-02-12 LAB — LIPID PANEL
Chol/HDL Ratio: 4 {ratio} (ref 0.0–4.4)
Cholesterol, Total: 136 mg/dL (ref 100–199)
HDL: 34 mg/dL — ABNORMAL LOW (ref >39)
LDL Chol Calc (NIH): 90 mg/dL (ref 0–99)
Triglycerides: 59 mg/dL (ref 0–149)
VLDL Cholesterol Cal: 12 mg/dL (ref 5–40)

## 2023-02-12 LAB — TSH: TSH: 1.25 u[IU]/mL (ref 0.450–4.500)

## 2023-02-17 NOTE — Progress Notes (Signed)
Complete physical exam  Patient: Beth Goodwin   DOB: 31-Dec-1995   26 y.o. Female  MRN: [id number]  Subjective:    Chief Complaint  Patient presents with   Annual Exam    Beth Goodwin is a 27 y.o. female who presents today for a complete physical exam. She reports consuming a general diet.  She generally feels well. 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  She does not have additional problems to discuss today.    Most recent fall risk assessment:    09/30/2022    9:43 AM  Fall Risk   Falls in the past year? 0  Number falls in past yr: 0  Injury with Fall? 0  Risk for fall due to : No Fall Risks  Follow up Falls evaluation completed     Most recent depression and anxiety screenings:    02/18/2023    2:02 PM 09/30/2022    9:42 AM  PHQ 2/9 Scores  PHQ - 2 Score 1 4  PHQ- 9 Score 7 11      02/18/2023    2:02 PM 09/30/2022    9:42 AM 05/21/2022    9:41 AM 12/15/2021   11:11 AM  GAD 7 : Generalized Anxiety Score  Nervous, Anxious, on Edge 0 0 0 0  Control/stop worrying 1 2 0 0  Worry too much - different things 1 2 0 0  Trouble relaxing 0 2 1 0  Restless 0 2 1 1   Easily annoyed or irritable 1 2 1 1   Afraid - awful might happen 0 2 0 0  Total GAD 7 Score 3 12 3 2   Anxiety Difficulty Not difficult at all Somewhat difficult  Not difficult at all    Patient Active Problem List   Diagnosis Date Noted   Diabetes mellitus without complication (HCC) 02/19/2023   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   Other atopic dermatitis 11/04/2022   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   Xerosis of skin 07/15/2022   Seasonal and perennial allergic rhinoconjunctivitis 05/21/2022   Vitamin D deficiency 03/05/2022   Hidradenitis suppurativa of left axilla 12/15/2021   Nexplanon in place, replaced 12/03/17 09/06/2017   BMI 45.0-49.9, adult (HCC) 09/06/2017    Past Surgical  History:  Procedure Laterality Date   CESAREAN SECTION N/A 09/10/2014   Procedure: CESAREAN SECTION;  Surgeon: Catalina Antigua, MD;  Location: [facility];  Service: Obstetrics;  Laterality: N/A;   CHOLECYSTECTOMY N/A 11/02/2014   Procedure: LAPAROSCOPIC CHOLECYSTECTOMY WITH INTRAOPERATIVE CHOLANGIOGRAM;  Surgeon: Chevis Pretty III, MD;  Location: [facility];  Service: General;  Laterality: N/A;   Social History   Tobacco Use   Smoking status: Never    Passive exposure: Never   Smokeless tobacco: Never  Vaping Use   Vaping status: Never Used  Substance Use Topics   Alcohol use: No   Drug use: Yes    Types: Marijuana    Comment: odaily   Family History  Problem Relation Age of Onset   Hypertension Father    Diabetes Father    Hypertension Mother    Diabetes Maternal Aunt    Diabetes Maternal Grandmother    Diabetes Paternal Grandmother    Allergies  Allergen Reactions   Amoxicillin Hives    Has patient had a PCN reaction causing immediate rash, facial/tongue/throat swelling, SOB or lightheadedness with hypotension: Yes Has patient had a PCN reaction  causing severe rash involving mucus membranes or skin necrosis: No Has patient had a PCN reaction that required hospitalization: No Has patient had a PCN reaction occurring within the last 10 years: No If all of the above answers are "NO", then may proceed with Cephalosporin use.      Patient Care Team: Melida Quitter, PA as PCP - General (Family Medicine)   Outpatient Medications Prior to Visit  Medication Sig   etonogestrel (NEXPLANON) 68 MG IMPL implant Inject into the skin.   levocetirizine (XYZAL) 5 MG tablet Take 1 tablet (5 mg total) by mouth every evening.   montelukast (SINGULAIR) 10 MG tablet Take 1 tablet (10 mg total) by mouth at bedtime.   triamcinolone ointment (KENALOG) 0.1 % Apply 1 Application topically 2 (two) times daily as needed (rash flare). Do not use on the face, neck, armpits or groin area. Do not use more  than 3 weeks in a row.   Vitamin D, Ergocalciferol, (DRISDOL) 1.25 MG (50000 UNIT) CAPS capsule Take 50,000 Units by mouth every 7 (seven) days.   No facility-administered medications prior to visit.    Review of Systems  Constitutional:  Negative for chills, fever and malaise/fatigue.  HENT:  Negative for congestion and hearing loss.   Eyes:  Negative for blurred vision and double vision.  Respiratory:  Negative for cough and shortness of breath.   Cardiovascular:  Negative for chest pain, palpitations and leg swelling.  Gastrointestinal:  Negative for abdominal pain, constipation, diarrhea and heartburn.  Genitourinary:  Negative for frequency and urgency.  Musculoskeletal:  Negative for myalgias and neck pain.  Neurological:  Negative for headaches.  Endo/Heme/Allergies:  Negative for polydipsia.  Psychiatric/Behavioral:  Negative for depression. The patient is not nervous/anxious.       Objective:    BP 119/78 (BP Location: Right Arm, Patient Position: Sitting, Cuff Size: Large)   Pulse 83   Resp 18   Ht 5\' 4"  (1.626 m)   Wt 282 lb (127.9 kg)   LMP 01/30/2023 (Exact Date)   SpO2 100%   BMI 48.41 kg/m    Physical Exam Constitutional:      General: She is not in acute distress.    Appearance: Normal appearance.  HENT:     Head: Normocephalic and atraumatic.     Right Ear: Tympanic membrane, ear canal and external ear normal.     Left Ear: Tympanic membrane, ear canal and external ear normal.     Nose: Nose normal.     Mouth/Throat:     Mouth: Mucous membranes are moist.     Pharynx: No oropharyngeal exudate or posterior oropharyngeal erythema.  Eyes:     Extraocular Movements: Extraocular movements intact.     Conjunctiva/sclera: Conjunctivae normal.     Pupils: Pupils are equal, round, and reactive to light.     Comments: Wears glasses  Neck:     Thyroid: No thyroid mass, thyromegaly or thyroid tenderness.  Cardiovascular:     Rate and Rhythm: Normal rate and  regular rhythm.     Heart sounds: Normal heart sounds. No murmur heard.    No friction rub. No gallop.  Pulmonary:     Effort: Pulmonary effort is normal. No respiratory distress.     Breath sounds: Normal breath sounds. No wheezing, rhonchi or rales.  Abdominal:     General: Abdomen is flat. Bowel sounds are normal. There is no distension.     Palpations: There is no mass.     Tenderness:  There is no abdominal tenderness. There is no guarding.  Musculoskeletal:        General: Normal range of motion.     Cervical back: Normal range of motion and neck supple.     Right lower leg: No edema.     Left lower leg: No edema.  Lymphadenopathy:     Cervical: No cervical adenopathy.  Skin:    General: Skin is warm and dry.  Neurological:     Mental Status: She is alert and oriented to person, place, and time.     Cranial Nerves: No cranial nerve deficit.     Motor: No weakness.     Deep Tendon Reflexes: Reflexes normal.  Psychiatric:        Mood and Affect: Mood normal.       Assessment & Plan:    Routine Health Maintenance and Physical Exam  Immunization History  Administered Date(s) Administered   DTaP 06/06/1997, 08/08/1997, 10/03/1997, 07/10/1998, 04/25/2001   HIB (PRP-OMP) 06/06/1997, 08/08/1997, 04/10/1998   HPV 9-valent 09/16/2010, 10/05/2013   Hepatitis A, Ped/Adol-2 Dose 04/19/2006, 02/01/2007   Hepatitis B, PED/ADOLESCENT 1995/09/26, 06/06/1997, 10/03/1997   IPV 06/06/1997, 08/08/1997, 04/10/1998, 04/25/2001   Influenza,inj,Quad PF,6+ Mos 06/28/2014   Influenza-Unspecified 04/19/2006, 02/01/2007   MMR 04/10/1998, 04/25/2001   Meningococcal Conjugate 05/29/2008, 10/05/2013   PFIZER(Purple Top)SARS-COV-2 Vaccination 01/22/2021, 02/12/2021   Pneumococcal Conjugate PCV 7 05/16/1999   Tdap 05/29/2008, 06/28/2014, 07/06/2015   Varicella 04/10/1998, 04/19/2006    Health Maintenance  Topic Date Due   FOOT EXAM  Never done   OPHTHALMOLOGY EXAM  Never done   HPV VACCINES  (3 - Risk 3-dose series) 02/05/2014   Diabetic kidney evaluation - Urine ACR  Never done   COVID-19 Vaccine (3 - 2023-24 season) 03/05/2023 (Originally 01/10/2023)   INFLUENZA VACCINE  08/09/2023 (Originally 12/10/2022)   HEMOGLOBIN A1C  08/12/2023   Diabetic kidney evaluation - eGFR measurement  02/11/2024   Cervical Cancer Screening (Pap smear)  02/17/2025   DTaP/Tdap/Td (9 - Td or Tdap) 07/05/2025   Hepatitis C Screening  Completed   HIV Screening  Completed    Reviewed most recent labs including CBC, CMP, lipid panel, A1C, TSH, and vitamin D. All within normal limits/stable from last check other than A1c increased to 6.7 and now meets criteria for diagnosis of diabetes, ALT slightly elevated at 36, HDL still low.  Discussed health benefits of physical activity, and encouraged her to engage in regular exercise appropriate for her age and condition.  Wellness examination  Diabetes mellitus without complication (HCC) Assessment & Plan: Discussed new diagnosis of diabetes with A1c of 6.7.  Her father has diabetes but she is not sure if it is type I or type II.  We will test for antibodies to distinguish type I versus type 2 diabetes given her family history.  She also be interested in further education and information, providing referral.  At this time, she is not interested in starting medication, discussed other ways to balance blood sugar through nutrition and physical activity.  Orders: -     Amb Referral to Nutrition and Diabetic Education -     IA-2 Autoantibodies; Future -     Glutamic acid decarboxylase auto abs; Future  HSV-2 seropositive Assessment & Plan: Asymptomatic.  Patient agreeable to episodic treatment with antivirals, discussed symptoms to monitor for.     Return in about 4 months (around 06/21/2023) for follow-up for DM, POC A1C at visit.     Melida Quitter, PA

## 2023-02-18 ENCOUNTER — Ambulatory Visit (INDEPENDENT_AMBULATORY_CARE_PROVIDER_SITE_OTHER): Payer: Medicaid Other | Admitting: Family Medicine

## 2023-02-18 ENCOUNTER — Encounter: Payer: Self-pay | Admitting: Family Medicine

## 2023-02-18 ENCOUNTER — Encounter: Payer: Medicaid Other | Admitting: Nurse Practitioner

## 2023-02-18 VITALS — BP 119/78 | HR 83 | Resp 18 | Ht 64.0 in | Wt 282.0 lb

## 2023-02-18 DIAGNOSIS — Z0000 Encounter for general adult medical examination without abnormal findings: Secondary | ICD-10-CM | POA: Diagnosis not present

## 2023-02-18 DIAGNOSIS — E119 Type 2 diabetes mellitus without complications: Secondary | ICD-10-CM

## 2023-02-18 DIAGNOSIS — R768 Other specified abnormal immunological findings in serum: Secondary | ICD-10-CM | POA: Diagnosis not present

## 2023-02-18 NOTE — Patient Instructions (Addendum)
I provided some additional information if you would like to read it about HSV-2 as well as diabetes.  You should be getting a call to schedule a time to get more information and education about diabetes!  In the meantime, we will get lab in the next couple of weeks to determine if you have type 1 or type 2 diabetes for sure.  Therapy recommendations: -[place] partners for mental health -[place] [facility] -All are welcome counseling -Psychology today website: Find a therapist  Nutritionist with great info on balancing blood sugar Leane Call [email or web address]  TikTok:b [email or web address].tiktok.[email or web address] ?lang=en [email or web address]  Instagram: [email or web address].info/ [email or web address]  Website: [email or web address] 

## 2023-02-19 ENCOUNTER — Encounter: Payer: Self-pay | Admitting: Family Medicine

## 2023-02-19 DIAGNOSIS — R768 Other specified abnormal immunological findings in serum: Secondary | ICD-10-CM | POA: Insufficient documentation

## 2023-02-19 DIAGNOSIS — E119 Type 2 diabetes mellitus without complications: Secondary | ICD-10-CM | POA: Insufficient documentation

## 2023-02-19 HISTORY — DX: Type 2 diabetes mellitus without complications: E11.9

## 2023-02-19 MED ORDER — METFORMIN HCL 500 MG PO TABS
500.0000 mg | ORAL_TABLET | Freq: Two times a day (BID) | ORAL | 1 refills | Status: DC
Start: 2023-02-19 — End: 2024-03-01

## 2023-02-19 NOTE — Assessment & Plan Note (Signed)
Discussed new diagnosis of diabetes with A1c of 6.7.  Her father has diabetes but she is not sure if it is type I or type II.  We will test for antibodies to distinguish type I versus type 2 diabetes given her family history.  She also be interested in further education and information, providing referral.  At this time, she is not interested in starting medication, discussed other ways to balance blood sugar through nutrition and physical activity.

## 2023-02-19 NOTE — Assessment & Plan Note (Signed)
Asymptomatic.  Patient agreeable to episodic treatment with antivirals, discussed symptoms to monitor for.

## 2023-03-04 ENCOUNTER — Other Ambulatory Visit: Payer: Medicaid Other

## 2023-03-04 DIAGNOSIS — E119 Type 2 diabetes mellitus without complications: Secondary | ICD-10-CM

## 2023-03-12 DIAGNOSIS — Z419 Encounter for procedure for purposes other than remedying health state, unspecified: Secondary | ICD-10-CM | POA: Diagnosis not present

## 2023-03-20 LAB — GLUTAMIC ACID DECARBOXYLASE AUTO ABS: Glutamic Acid Decarb Ab: <5 U/mL (ref 0.0–5.0)

## 2023-03-20 LAB — IA-2 AUTOANTIBODIES: IA-2 Autoantibodies: <7.5 U/mL

## 2023-04-11 DIAGNOSIS — Z419 Encounter for procedure for purposes other than remedying health state, unspecified: Secondary | ICD-10-CM | POA: Diagnosis not present

## 2023-04-13 ENCOUNTER — Ambulatory Visit (INDEPENDENT_AMBULATORY_CARE_PROVIDER_SITE_OTHER): Payer: Medicaid Other | Admitting: Family Medicine

## 2023-04-13 ENCOUNTER — Encounter: Payer: Self-pay | Admitting: Family Medicine

## 2023-04-13 VITALS — BP 124/77 | HR 87 | Resp 18 | Ht 64.0 in | Wt 286.0 lb

## 2023-04-13 DIAGNOSIS — Z113 Encounter for screening for infections with a predominantly sexual mode of transmission: Secondary | ICD-10-CM

## 2023-04-13 DIAGNOSIS — E119 Type 2 diabetes mellitus without complications: Secondary | ICD-10-CM

## 2023-04-13 LAB — POCT UA - MICROALBUMIN
Albumin/Creatinine Ratio, Urine, POC: <30
Creatinine, POC: 100 mg/dL
Microalbumin Ur, POC: 10 mg/L

## 2023-04-13 NOTE — Progress Notes (Signed)
   Acute Office Visit  Subjective:     Patient ID: Beth Goodwin, female    DOB: 1995/10/15, 27 y.o.   MRN: [id number]  No chief complaint on file.   HPI Patient is in today for STI testing.  She states that she would like to be tested for all STIs.  She is in a mutually monogamous relationship with her partner, but states that for peace of mind for both of them she would like to be screened for STIs after testing positive for HSV-2 not long ago.  Denies any current symptoms or breakouts.  ROS See HPI    Objective:    BP 124/77 (BP Location: Left Arm, Patient Position: Sitting, Cuff Size: Large)   Pulse 87   Resp 18   Ht 5\' 4"  (1.626 m)   Wt 286 lb (129.7 kg)   SpO2 100%   BMI 49.09 kg/m   Physical Exam Constitutional:      General: She is not in acute distress.    Appearance: Normal appearance.  HENT:     Head: Normocephalic and atraumatic.  Cardiovascular:     Rate and Rhythm: Normal rate and regular rhythm.     Heart sounds: No murmur heard.    No friction rub. No gallop.  Pulmonary:     Effort: Pulmonary effort is normal. No respiratory distress.     Breath sounds: No wheezing, rhonchi or rales.  Skin:    General: Skin is warm and dry.  Neurological:     Mental Status: She is alert and oriented to person, place, and time.    Results for orders placed or performed in visit on 04/13/23  POCT UA - Microalbumin  Result Value Ref Range   Microalbumin Ur, POC 10 mg/L   Creatinine, POC 100 mg/dL   Albumin/Creatinine Ratio, Urine, POC <30       Assessment & Plan:  Screening examination for STI -     HepB+HepC+HIV Panel; Future -     RPR; Future -     Herpes simplex virus culture; Future -     Chlamydia/Gonococcus/Trichomonas, NAA; Future  Diabetes mellitus without complication (HCC) -     POCT UA - Microalbumin  Collecting urine sample today for chlamydia/gonorrhea/trichomonas.  Patient will return for blood work to check for hepatitis, HIV, syphilis,  HSV.  Return in about 1 day (around 04/14/2023) for nonfasting blood work.  Melida Quitter, PA

## 2023-04-13 NOTE — Patient Instructions (Signed)
We will check for STIs using the urine sample that you left today as well as blood work that we will collect in the next week or so.  Once we do have those results, I will send you a message on MyChart!

## 2023-04-14 ENCOUNTER — Other Ambulatory Visit: Payer: Medicaid Other

## 2023-04-15 LAB — CHLAMYDIA/GONOCOCCUS/TRICHOMONAS, NAA
Chlamydia by NAA: NEGATIVE
Gonococcus by NAA: NEGATIVE
Trich vag by NAA: NEGATIVE

## 2023-05-12 DIAGNOSIS — Z419 Encounter for procedure for purposes other than remedying health state, unspecified: Secondary | ICD-10-CM | POA: Diagnosis not present

## 2023-06-01 ENCOUNTER — Ambulatory Visit (INDEPENDENT_AMBULATORY_CARE_PROVIDER_SITE_OTHER): Payer: Medicaid Other | Admitting: Dermatology

## 2023-06-01 ENCOUNTER — Encounter: Payer: Self-pay | Admitting: Dermatology

## 2023-06-01 VITALS — BP 154/102

## 2023-06-01 DIAGNOSIS — L853 Xerosis cutis: Secondary | ICD-10-CM | POA: Diagnosis not present

## 2023-06-01 DIAGNOSIS — L732 Hidradenitis suppurativa: Secondary | ICD-10-CM

## 2023-06-01 DIAGNOSIS — L858 Other specified epidermal thickening: Secondary | ICD-10-CM | POA: Diagnosis not present

## 2023-06-01 MED ORDER — CLINDAMYCIN PHOSPHATE 1 % EX LOTN
TOPICAL_LOTION | Freq: Every day | CUTANEOUS | 3 refills | Status: DC
Start: 2023-06-01 — End: 2024-02-17

## 2023-06-01 MED ORDER — MINOCYCLINE HCL 100 MG PO CAPS: 100.0000 mg | ORAL_CAPSULE | Freq: Two times a day (BID) | ORAL | 0 refills | Status: AC

## 2023-06-01 NOTE — Progress Notes (Signed)
   New Patient Visit   Subjective  Beth Goodwin is a 28 y.o. female who presents for the following: HS  Patient states she has HS located at the under bilateral breast & bilateral axilla that she would like to have examined. Patient reports the areas have been there for 5 years. She reports the areas are bothersome.Patient rates irritation 8 out of 10. She states that the areas have not spread. Patient reports she has not previously been treated for these areas but PCP referred her to dermatology. She is not having a flare currently. She is here today for preventative measures. Patient denies Hx of bx. Patient denies family history of skin cancer(s).   The following portions of the chart were reviewed this encounter and updated as appropriate: medications, allergies, medical history  Review of Systems:  No other skin or systemic complaints except as noted in HPI or Assessment and Plan.  Objective  Well appearing patient in no apparent distress; mood and affect are within normal limits.   A focused examination was performed of the following areas: bilateral axilla & under breast   Relevant exam findings are noted in the Assessment and Plan.    Assessment & Plan   Hidradenitis Suppurativa Assessment: Patient with a 5-year history of recurrent boils in axillae, inframammary, and inguinal regions. Examination shows scarring under the breast without active lesions. .  Plan:   Daily washing with antibacterial soap.   Use Benzoyl peroxide wash (e.g., CeraVe Acne Face Wash) followed by gentle soap (e.g., Dove).   Minocycline: twice daily for up to one week at the first sign of flare.   Apply Clindamycin lotion topically.   Dietary modifications: Increase intake of anti-inflammatory foods (green leafy vegetables, salmon, walnuts, deeply pigmented fruits like blueberries and raspberries), avoid grapes.   Follow-up in 4 months to assess progress.   Consider biologics (Humira,  Cosentyx, or Bimzelx) if condition worsens.   [facility] [address].  Xerosis with Keratosis Pilaris Assessment: Patient reports rough skin on shoulders, consistent with xerosis and possibly keratosis pilaris. Current use of Aquaphor and Vaseline noted, with caution advised due to potential for pore clogging.  Plan:   Avoid hot showers.   Apply La Roche-Posay triple moisture cream with urea.   If persistent dryness, apply Aquaphor over moisturizer.   For washing: use Dial for axillae and inguinal regions, Dove for the rest of the body.   Benzoyl peroxide wash for hidradenitis suppurativa-prone areas only.    No follow-ups on file.    Documentation: I have reviewed the above documentation for accuracy and completeness, and I agree with the above.  I, Shirron Marcha Solders, CMA, am acting as scribe for [employer], DO.   Beth Reusing, DO

## 2023-06-01 NOTE — Patient Instructions (Addendum)
Hello Ms. Viviann Spare,  Thank you for visiting my office today. Your dedication to managing your hidradenitis suppurativa and improving your health is greatly appreciated. Below is a summary of our discussion and your personalized treatment plan:  Dietary Changes:   Increase your intake of anti-inflammatory foods, including green leafy vegetables, salmon, walnuts, and deeply pigmented fruits such as blueberries and raspberries.   Avoid high-sugar foods, specifically grapes.  Daily Hygiene:   Wash daily with an antibacterial soap. It is recommended to use CeraVe Acne Face Wash followed by a gentle soap like Dove.  Medications:   Minocycline: To be taken twice a day at the first sign of a flare, for up to a week.   Clindamycin Lotion: Apply topically as directed.  Skin Care for Dry Shoulders:   Use La Roche-Posay's triple moisture cream with urea to moisturize.   Avoid hot showers to prevent stripping natural oils.   Consider using Aquaphor sparingly if shoulders remain dry after moisturizing.  Follow-Up Appointment:   Schedule a follow-up in 4 months to assess progress. Depending on the effectiveness of the current regimen, we may consider advanced treatments like biologics.  [facility] [address]. Should you have any questions or concerns before our next appointment, please do not hesitate to reach out.  Warm regards,  Dr. Lang[facility] Reusing [facility]     Important Information  Due to recent changes in healthcare laws, you may see results of your pathology and/or laboratory studies on MyChart before the doctors have had a chance to review them. We understand that in some cases there may be results that are confusing or concerning to you. Please understand that not all results are received at the same time and often the doctors may need to interpret multiple results in order to provide you with the best plan of care or course of treatment.  Therefore, we ask that you please give [place] 2 business days to thoroughly review all your results before contacting the office for clarification. Should we see a critical lab result, you will be contacted sooner.   If You Need Anything After Your Visit  If you have any questions or concerns for your doctor, please call our main line at [phone] If no one answers, please leave a voicemail as directed and we will return your call as soon as possible. Messages left after 4 pm will be answered the following business day.   You may also send [place] a message via MyChart. We typically respond to MyChart messages within 1-2 business days.  For prescription refills, please ask your pharmacy to contact our office. Our fax number is [phone].  If you have an urgent issue when the clinic is closed that cannot wait until the next business day, you can page your doctor at the number below.    Please note that while we do our best to be available for urgent issues outside of office hours, we are not available 24/7.   If you have an urgent issue and are unable to reach [place], you may choose to seek medical care at your doctor's office, retail clinic, urgent care center, or emergency room.  If you have a medical emergency, please immediately call 911 or go to the emergency department. In the event of inclement weather, please call our main line at [phone] for an update on the status of any delays or closures.  Dermatology Medication Tips: Please keep the boxes that topical medications come in  in order to help keep track of the instructions about where and how to use these. Pharmacies typically print the medication instructions only on the boxes and not directly on the medication tubes.   If your medication is too expensive, please contact our office at [phone] or send [place] a message through MyChart.   We are unable to tell what your co-pay for medications will be in advance as this is different  depending on your insurance coverage. However, we may be able to find a substitute medication at lower cost or fill out paperwork to get insurance to cover a needed medication.   If a prior authorization is required to get your medication covered by your insurance company, please allow [place] 1-2 business days to complete this process.  Drug prices often vary depending on where the prescription is filled and some pharmacies may offer cheaper prices.  The website www.goodrx.com contains coupons for medications through different pharmacies. The prices here do not account for what the cost may be with help from insurance (it may be cheaper with your insurance), but the website can give you the price if you did not use any insurance.  - You can print the associated coupon and take it with your prescription to the pharmacy.  - You may also stop by our office during regular business hours and pick up a GoodRx coupon card.  - If you need your prescription sent electronically to a different pharmacy, notify our office through [facility] or by phone at [phone]

## 2023-06-12 DIAGNOSIS — Z419 Encounter for procedure for purposes other than remedying health state, unspecified: Secondary | ICD-10-CM | POA: Diagnosis not present

## 2023-06-17 ENCOUNTER — Encounter: Payer: Self-pay | Admitting: Family Medicine

## 2023-06-21 ENCOUNTER — Ambulatory Visit: Payer: Medicaid Other | Admitting: Family Medicine

## 2023-07-10 DIAGNOSIS — Z419 Encounter for procedure for purposes other than remedying health state, unspecified: Secondary | ICD-10-CM | POA: Diagnosis not present

## 2023-07-11 ENCOUNTER — Encounter (HOSPITAL_COMMUNITY): Payer: Self-pay

## 2023-07-11 ENCOUNTER — Other Ambulatory Visit: Payer: Self-pay

## 2023-07-11 ENCOUNTER — Emergency Department (HOSPITAL_COMMUNITY)
Admission: EM | Admit: 2023-07-11 | Discharge: 2023-07-11 | Disposition: A | Discharge status: Home / Self Care | Attending: Emergency Medicine | Admitting: Emergency Medicine

## 2023-07-11 DIAGNOSIS — J029 Acute pharyngitis, unspecified: Secondary | ICD-10-CM | POA: Diagnosis present

## 2023-07-11 DIAGNOSIS — E119 Type 2 diabetes mellitus without complications: Secondary | ICD-10-CM | POA: Diagnosis not present

## 2023-07-11 DIAGNOSIS — Z7984 Long term (current) use of oral hypoglycemic drugs: Secondary | ICD-10-CM | POA: Diagnosis not present

## 2023-07-11 LAB — RESP PANEL BY RT-PCR (RSV, FLU A&B, COVID)  RVPGX2
Influenza A by PCR: NEGATIVE
Influenza B by PCR: NEGATIVE
Resp Syncytial Virus by PCR: NEGATIVE
SARS Coronavirus 2 by RT PCR: NEGATIVE

## 2023-07-11 LAB — GROUP A STREP BY PCR: Group A Strep by PCR: NOT DETECTED

## 2023-07-11 MED ORDER — ALUM & MAG HYDROXIDE-SIMETH 200-200-20 MG/5ML PO SUSP
30.0000 mL | Freq: Once | ORAL | Status: AC
  Administered 2023-07-11: 30 mL via ORAL
  Filled 2023-07-11: qty 30

## 2023-07-11 MED ORDER — NAPROXEN 500 MG PO TABS: 500.0000 mg | ORAL_TABLET | Freq: Two times a day (BID) | ORAL | 0 refills | Status: DC | PRN

## 2023-07-11 MED ORDER — KETOROLAC TROMETHAMINE 15 MG/ML IJ SOLN
15.0000 mg | Freq: Once | INTRAMUSCULAR | Status: AC
  Administered 2023-07-11: 15 mg via INTRAMUSCULAR
  Filled 2023-07-11: qty 1

## 2023-07-11 MED ORDER — CHLORASEPTIC 1.4 % MT LIQD: 1.0000 | OROMUCOSAL | 0 refills | Status: DC | PRN

## 2023-07-11 MED ORDER — LIDOCAINE VISCOUS HCL 2 % MT SOLN
15.0000 mL | Freq: Once | OROMUCOSAL | Status: AC
  Administered 2023-07-11: 15 mL via ORAL
  Filled 2023-07-11: qty 15

## 2023-07-11 MED ORDER — DEXAMETHASONE 4 MG PO TABS
10.0000 mg | ORAL_TABLET | Freq: Once | ORAL | Status: AC
  Administered 2023-07-11: 10 mg via ORAL
  Filled 2023-07-11: qty 1

## 2023-07-11 NOTE — ED Notes (Signed)
 Discharge instructions reviewed with patient. Patient questions answered and opportunity for education reviewed. Patient voices understanding of discharge instructions with no further questions. Patient ambulatory with steady gait to lobby.

## 2023-07-11 NOTE — ED Triage Notes (Signed)
 Pt reports severe sore throat x1.5 weeks. Pt denies sick contacts. Has tried OTC meds with no relief.

## 2023-07-11 NOTE — Discharge Instructions (Addendum)
 We recommend use of naproxen for management of pain.  Take as prescribed.  You may also benefit from the use of Chloraseptic spray.  Continue to drink plenty of fluids to prevent dehydration.  Follow-up with a primary care doctor to ensure that symptoms resolve.

## 2023-07-11 NOTE — ED Provider Notes (Signed)
 [place] [facility] DEPARTMENT AT [facility] Provider Note   CSN: [id number] Arrival date & time: 07/11/23  2055     History  Chief Complaint  Patient presents with   Sore Throat    Beth Goodwin is a 28 y.o. female.  28 year old female presents to the emergency department for evaluation of sore throat x 1.5 weeks.  She states that she was around her nieces and nephews who were sick with an upper respiratory illness, but she has also had some sick coworkers she has been exposed to.  She has been using TheraFlu as well as chamomile tea and lemon tea for symptoms without relief.  Reports discomfort with swallowing, but no inability to swallow, drooling, fevers, vomitng.  Continues to drink fluids, eat solids.  The history is provided by the patient. No language interpreter was used.  Sore Throat       Home Medications Prior to Admission medications   Medication Sig Start Date End Date Taking? Authorizing Provider  naproxen (NAPROSYN) 500 MG tablet Take 1 tablet (500 mg total) by mouth every 12 (twelve) hours as needed for mild pain (pain score 1-3) or moderate pain (pain score 4-6). 07/11/23  Yes Antony Madura, PA-C  phenol (CHLORASEPTIC) 1.4 % LIQD Use as directed 1 spray in the mouth or throat as needed for throat irritation / pain. 07/11/23  Yes Antony Madura, PA-C  clindamycin (CLEOCIN-T) 1 % lotion Apply topically daily. As needed for flares 06/01/23 05/31/24  Terri Piedra, DO  etonogestrel (NEXPLANON) 68 MG IMPL implant Inject into the skin.    [provider]  levocetirizine (XYZAL) 5 MG tablet Take 1 tablet (5 mg total) by mouth every evening. 11/04/22   Ellamae Sia, DO  metFORMIN (GLUCOPHAGE) 500 MG tablet Take 1 tablet (500 mg total) by mouth 2 (two) times daily with a meal. 02/19/23   Saralyn Pilar A, PA  montelukast (SINGULAIR) 10 MG tablet Take 1 tablet (10 mg total) by mouth at bedtime. 11/04/22   Ellamae Sia, DO  triamcinolone ointment  (KENALOG) 0.1 % Apply 1 Application topically 2 (two) times daily as needed (rash flare). Do not use on the face, neck, armpits or groin area. Do not use more than 3 weeks in a row. 11/04/22   Ellamae Sia, DO  Vitamin D, Ergocalciferol, (DRISDOL) 1.25 MG (50000 UNIT) CAPS capsule Take 50,000 Units by mouth every 7 (seven) days.    [provider]  dicyclomine (BENTYL) 20 MG tablet Take 1 tablet (20 mg total) by mouth 2 (two) times daily. Patient not taking: Reported on 11/28/2016 05/23/16 04/14/19  Marlon Pel, PA-C      Allergies    Amoxicillin    Review of Systems   Review of Systems Ten systems reviewed and are negative for acute change, except as noted in the HPI.    Physical Exam Updated Vital Signs BP (!) 131/94 (BP Location: Right Arm)   Pulse (!) 101   Temp 98.7 F (37.1 C) (Oral)   Resp 18   Ht 5\' 4"  (1.626 m)   Wt 129.7 kg   SpO2 92%   BMI 49.09 kg/m   Physical Exam Vitals and nursing note reviewed.  Constitutional:      General: She is not in acute distress.    Appearance: She is well-developed. She is not diaphoretic.     Comments: Nontoxic appearing and in NAD  HENT:     Head: Normocephalic and atraumatic.  Mouth/Throat:     Comments: Mild posterior oropharyngeal erythema without edema.  No posterior oropharyngeal ulcerations, petechiae.  No plaques or exudates noted.  Tolerating secretions without difficulty.  Normal phonation.  Uvula midline. Eyes:     General: No scleral icterus.    Conjunctiva/sclera: Conjunctivae normal.  Pulmonary:     Effort: Pulmonary effort is normal. No respiratory distress.     Comments: Respirations even and unlabored Musculoskeletal:        General: Normal range of motion.     Cervical back: Normal range of motion.  Skin:    General: Skin is warm and dry.     Coloration: Skin is not pale.     Findings: No erythema or rash.  Neurological:     Mental Status: She is alert and oriented to person, place, and time.   Psychiatric:        Behavior: Behavior normal.     ED Results / Procedures / Treatments   Labs (all labs ordered are listed, but only abnormal results are displayed) Labs Reviewed  RESP PANEL BY RT-PCR (RSV, FLU A&B, COVID)  RVPGX2  GROUP A STREP BY PCR    EKG None  Radiology No results found.  Procedures Procedures    Medications Ordered in ED Medications  ketorolac (TORADOL) 15 MG/ML injection 15 mg (15 mg Intramuscular Given 07/11/23 2245)  alum & mag hydroxide-simeth (MAALOX/MYLANTA) 200-200-20 MG/5ML suspension 30 mL (30 mLs Oral Given 07/11/23 2245)    And  lidocaine (XYLOCAINE) 2 % viscous mouth solution 15 mL (15 mLs Oral Given 07/11/23 2245)  dexamethasone (DECADRON) tablet 10 mg (10 mg Oral Given 07/11/23 2245)    ED Course/ Medical Decision Making/ A&P                                 Medical Decision Making Risk OTC drugs. Prescription drug management.   This patient presents to the ED for concern of sore throat, this involves an extensive number of treatment options, and is a complaint that carries with it a high risk of complications and morbidity.  The differential diagnosis includes viral illness vs strep throat vs PTA vs RPA vs epiglottitis   Co morbidities that complicate the patient evaluation  GERD DM   Lab Tests:  I Ordered, and personally interpreted labs.  The pertinent results include:  Negative viral panel. Negative strep screen.   Cardiac Monitoring:  The patient was maintained on a cardiac monitor.  I personally viewed and interpreted the cardiac monitored which showed an underlying rhythm of: not tachycardic as noted in triage   Medicines ordered and prescription drug management:  I ordered medication including Toradol, Decadron, and GI cocktail for pain  Reevaluation of the patient after these medicines showed that the patient improved I have reviewed the patients home medicines and have made adjustments as needed   Test  Considered:  CTA soft tissue - felt low yield. No clinical concern for PTA, RPA, epiglottitis.    Problem List / ED Course:  Pt afebrile without tonsillar exudate, negative strep. Presents with dysphagia x 1.5 weeks; diagnosis of viral pharyngitis. No abx indicated. D Presentation not concerning for PTA or infxn spread to soft tissue. No trismus or uvula deviation. Specific return precautions discussed. Tolerating oral fluids and medications. Recommended PCP follow up.   Reevaluation:  After the interventions noted above, I reevaluated the patient and found that they have :stayed the same  Dispostion:  After consideration of the diagnostic results and the patients response to treatment, I feel that the patent would benefit from supportive care, PCP f/u. Return precautions discussed and provided. Patient discharged in stable condition with no unaddressed concerns.          Final Clinical Impression(s) / ED Diagnoses Final diagnoses:  Pharyngitis, unspecified etiology    Rx / DC Orders ED Discharge Orders          Ordered    naproxen (NAPROSYN) 500 MG tablet  Every 12 hours PRN        07/11/23 2308    phenol (CHLORASEPTIC) 1.4 % LIQD  As needed        07/11/23 2308              Antony Madura, PA-C 07/11/23 2316    Melene Plan, DO 07/11/23 2339

## 2023-08-21 DIAGNOSIS — Z419 Encounter for procedure for purposes other than remedying health state, unspecified: Secondary | ICD-10-CM | POA: Diagnosis not present

## 2023-09-20 DIAGNOSIS — Z419 Encounter for procedure for purposes other than remedying health state, unspecified: Secondary | ICD-10-CM | POA: Diagnosis not present

## 2023-09-30 ENCOUNTER — Ambulatory Visit: Payer: Medicaid Other | Admitting: Dermatology

## 2023-10-06 ENCOUNTER — Ambulatory Visit (INDEPENDENT_AMBULATORY_CARE_PROVIDER_SITE_OTHER): Payer: Self-pay | Admitting: Dermatology

## 2023-10-06 DIAGNOSIS — Z91199 Patient's noncompliance with other medical treatment and regimen due to unspecified reason: Secondary | ICD-10-CM

## 2023-10-06 NOTE — Progress Notes (Signed)
 Patient no-showed today's appointment; appointment was for Follow Up

## 2023-10-21 DIAGNOSIS — Z419 Encounter for procedure for purposes other than remedying health state, unspecified: Secondary | ICD-10-CM | POA: Diagnosis not present

## 2023-11-04 ENCOUNTER — Ambulatory Visit (INDEPENDENT_AMBULATORY_CARE_PROVIDER_SITE_OTHER): Admitting: Family Medicine

## 2023-11-04 ENCOUNTER — Encounter: Payer: Self-pay | Admitting: Family Medicine

## 2023-11-04 VITALS — BP 130/85 | HR 95 | Ht 64.0 in | Wt 286.1 lb

## 2023-11-04 DIAGNOSIS — Z975 Presence of (intrauterine) contraceptive device: Secondary | ICD-10-CM | POA: Diagnosis not present

## 2023-11-04 DIAGNOSIS — Z113 Encounter for screening for infections with a predominantly sexual mode of transmission: Secondary | ICD-10-CM | POA: Diagnosis not present

## 2023-11-04 DIAGNOSIS — E119 Type 2 diabetes mellitus without complications: Secondary | ICD-10-CM | POA: Diagnosis not present

## 2023-11-04 NOTE — Progress Notes (Signed)
   Established Patient Office Visit  Subjective   Patient ID: Beth Goodwin, female    DOB: 10-01-1995  Age: 27 y.o. MRN: [id number]  Chief Complaint  Patient presents with   Contraception    HPI  Subjective - Nexplanon  due for replacement next month (expires 11/2023), placed by Dr. Araceli at [facility] off [address] - No issues with current Nexplanon  - Requests routine STI testing, missed last scheduled appointment - Sexually active with one partner, uses condoms - No urinary symptoms or discharge  Medications: Metformin  for diabetes, running low and due for refill soon.  PMHx: Diabetes mellitus. PSHx: None mentioned. FHx: None mentioned. Social Hx: Sexually active with one partner, uses barrier contraception.  ROS: Denies urinary symptoms, vaginal discharge, foot ulcerations or sores.   The ASCVD Risk score (Arnett DK, et al., 2019) failed to calculate for the following reasons:   The 2019 ASCVD risk score is only valid for ages 19 to 87  Health Maintenance Due  Topic Date Due   OPHTHALMOLOGY EXAM  Never done   HPV VACCINES (3 - Risk 3-dose series) 02/05/2014   Pneumococcal Vaccine 27-58 Years old (1 of 2 - PCV) 04/03/2016   COVID-19 Vaccine (3 - 2024-25 season) 01/10/2023   HEMOGLOBIN A1C  08/12/2023      Objective:     BP 130/85   Pulse 95   Ht 5' 4 (1.626 m)   Wt 286 lb 1.9 oz (129.8 kg)   SpO2 100%   BMI 49.11 kg/m    Physical Exam General: Well-appearing PULM: Clear to auscultation EXTREMITIES: Intact sensation to monofilament testing bilateral feet, no ulcerations or sores noted   No results found for any visits on 11/04/23.      Assessment & Plan:   Diabetes mellitus without complication (HCC) Assessment & Plan: A1C 6.7 in December, currently on Metformin  with good tolerance. - Refill Metformin  - Recheck A1C with blood work - Urine protein screening - Diabetic foot exam normal with intact sensation - Follow  up in 3-4 months with Beth Goodwin  Orders: -     Microalbumin / creatinine urine ratio -     Hemoglobin A1c -     Comprehensive metabolic panel with GFR -     Lipid panel  Screening examination for STI Assessment & Plan: Routine testing requested, sexually active with one partner using barrier protection. - Urine gonorrhea and chlamydia testing - Blood testing for syphilis and HIV  Orders: -     GC/Chlamydia Probe Amp -     RPR -     HIV Antibody (routine testing w rflx)  Nexplanon  in place, replaced 12/03/17 Assessment & Plan: Nexplanon  coming up on 3 years in place. Currently tolerating well with no side effects. Referred back to established provider Dr. Araceli at [facility] for [facility] for replacement as Nexplanon  not available at this clinic. - Continue current Nexplanon  until replacement - Contact Center for [facility] to schedule replacement      Return in about 3 months (around 02/04/2024) for DM.    Toribio MARLA Slain, MD

## 2023-11-04 NOTE — Assessment & Plan Note (Signed)
 Routine testing requested, sexually active with one partner using barrier protection. - Urine gonorrhea and chlamydia testing - Blood testing for syphilis and HIV

## 2023-11-04 NOTE — Assessment & Plan Note (Signed)
 A1C 6.7 in December, currently on Metformin  with good tolerance. - Refill Metformin  - Recheck A1C with blood work - Urine protein screening - Diabetic foot exam normal with intact sensation - Follow up in 3-4 months with [place]

## 2023-11-04 NOTE — Patient Instructions (Addendum)
 It was nice to see you today,  We addressed the following topics today: -The number for your OB/GYN, the [facility] is [phone].  Call them to schedule your appointment.  We do not do Nexplanon 's here but I would recommend continuing with your Nexplanon  for birth control, and it is due for replacement.  - I will order some blood and urine test to check for STIs.  I will you results when I get them - I have ordered some test for diabetes as well.  I will let you know these results when I get them.  Have a great day,  Rolan Slain, MD

## 2023-11-04 NOTE — Assessment & Plan Note (Signed)
 Nexplanon  coming up on 3 years in place. Currently tolerating well with no side effects. Referred back to established provider Dr. Araceli at [facility] for [facility] for replacement as Nexplanon  not available at this clinic. - Continue current Nexplanon  until replacement - Contact Center for [facility] to schedule replacement

## 2023-11-05 LAB — RPR: RPR Ser Ql: NONREACTIVE

## 2023-11-05 LAB — COMPREHENSIVE METABOLIC PANEL WITH GFR
ALT: 31 IU/L (ref 0–32)
AST: 33 IU/L (ref 0–40)
Albumin: 3.9 g/dL — ABNORMAL LOW (ref 4.0–5.0)
Alkaline Phosphatase: 80 IU/L (ref 44–121)
BUN/Creatinine Ratio: 14 (ref 9–23)
BUN: 9 mg/dL (ref 6–20)
Bilirubin Total: 0.4 mg/dL (ref 0.0–1.2)
CO2: 22 mmol/L (ref 20–29)
Calcium: 9.1 mg/dL (ref 8.7–10.2)
Chloride: 102 mmol/L (ref 96–106)
Creatinine, Ser: 0.66 mg/dL (ref 0.57–1.00)
Globulin, Total: 3 g/dL (ref 1.5–4.5)
Glucose: 137 mg/dL — ABNORMAL HIGH (ref 70–99)
Potassium: 4.4 mmol/L (ref 3.5–5.2)
Sodium: 138 mmol/L (ref 134–144)
Total Protein: 6.9 g/dL (ref 6.0–8.5)
eGFR: 124 mL/min/{1.73_m2} (ref >59)

## 2023-11-05 LAB — MICROALBUMIN / CREATININE URINE RATIO
Creatinine, Urine: 112.3 mg/dL
Microalb/Creat Ratio: <3 mg/g{creat} (ref 0–29)
Microalbumin, Urine: <3 ug/mL

## 2023-11-05 LAB — LIPID PANEL
Chol/HDL Ratio: 4.8 ratio — ABNORMAL HIGH (ref 0.0–4.4)
Cholesterol, Total: 149 mg/dL (ref 100–199)
HDL: 31 mg/dL — ABNORMAL LOW (ref >39)
LDL Chol Calc (NIH): 97 mg/dL (ref 0–99)
Triglycerides: 112 mg/dL (ref 0–149)
VLDL Cholesterol Cal: 21 mg/dL (ref 5–40)

## 2023-11-05 LAB — HEMOGLOBIN A1C
Est. average glucose Bld gHb Est-mCnc: 140 mg/dL
Hgb A1c MFr Bld: 6.5 % — ABNORMAL HIGH (ref 4.8–5.6)

## 2023-11-05 LAB — HIV ANTIBODY (ROUTINE TESTING W REFLEX): HIV Screen 4th Generation wRfx: NONREACTIVE

## 2023-11-07 ENCOUNTER — Ambulatory Visit: Payer: Self-pay | Admitting: Family Medicine

## 2023-11-07 DIAGNOSIS — E119 Type 2 diabetes mellitus without complications: Secondary | ICD-10-CM

## 2023-11-08 LAB — GC/CHLAMYDIA PROBE AMP
Chlamydia trachomatis, NAA: NEGATIVE
Neisseria Gonorrhoeae by PCR: NEGATIVE

## 2023-11-08 MED ORDER — OZEMPIC (0.25 OR 0.5 MG/DOSE) 2 MG/3ML ~~LOC~~ SOPN: 0.2500 mg | PEN_INJECTOR | SUBCUTANEOUS | 2 refills | Status: DC

## 2023-11-10 ENCOUNTER — Other Ambulatory Visit: Payer: Self-pay

## 2023-11-20 DIAGNOSIS — Z419 Encounter for procedure for purposes other than remedying health state, unspecified: Secondary | ICD-10-CM | POA: Diagnosis not present

## 2023-12-01 DIAGNOSIS — Z833 Family history of diabetes mellitus: Secondary | ICD-10-CM | POA: Diagnosis not present

## 2023-12-01 DIAGNOSIS — R030 Elevated blood-pressure reading, without diagnosis of hypertension: Secondary | ICD-10-CM | POA: Diagnosis not present

## 2023-12-01 DIAGNOSIS — Z7985 Long-term (current) use of injectable non-insulin antidiabetic drugs: Secondary | ICD-10-CM | POA: Diagnosis not present

## 2023-12-01 DIAGNOSIS — J302 Other seasonal allergic rhinitis: Secondary | ICD-10-CM | POA: Diagnosis not present

## 2023-12-01 DIAGNOSIS — Z6841 Body Mass Index (BMI) 40.0 and over, adult: Secondary | ICD-10-CM | POA: Diagnosis not present

## 2023-12-01 DIAGNOSIS — N181 Chronic kidney disease, stage 1: Secondary | ICD-10-CM | POA: Diagnosis not present

## 2023-12-01 DIAGNOSIS — E1122 Type 2 diabetes mellitus with diabetic chronic kidney disease: Secondary | ICD-10-CM | POA: Diagnosis not present

## 2023-12-01 DIAGNOSIS — Z7984 Long term (current) use of oral hypoglycemic drugs: Secondary | ICD-10-CM | POA: Diagnosis not present

## 2023-12-01 DIAGNOSIS — Z881 Allergy status to other antibiotic agents status: Secondary | ICD-10-CM | POA: Diagnosis not present

## 2023-12-21 DIAGNOSIS — Z419 Encounter for procedure for purposes other than remedying health state, unspecified: Secondary | ICD-10-CM | POA: Diagnosis not present

## 2023-12-28 ENCOUNTER — Ambulatory Visit: Admitting: Obstetrics and Gynecology

## 2023-12-28 ENCOUNTER — Other Ambulatory Visit: Payer: Self-pay

## 2023-12-28 ENCOUNTER — Encounter: Payer: Self-pay | Admitting: Obstetrics and Gynecology

## 2023-12-28 ENCOUNTER — Other Ambulatory Visit (HOSPITAL_COMMUNITY)
Admission: RE | Admit: 2023-12-28 | Discharge: 2023-12-28 | Disposition: A | Discharge status: Home / Self Care | Source: Ambulatory Visit | Attending: Obstetrics and Gynecology | Admitting: Obstetrics and Gynecology

## 2023-12-28 VITALS — BP 123/86 | HR 83 | Wt 283.2 lb

## 2023-12-28 DIAGNOSIS — Z1331 Encounter for screening for depression: Secondary | ICD-10-CM | POA: Diagnosis not present

## 2023-12-28 DIAGNOSIS — Z3046 Encounter for surveillance of implantable subdermal contraceptive: Secondary | ICD-10-CM

## 2023-12-28 DIAGNOSIS — Z3202 Encounter for pregnancy test, result negative: Secondary | ICD-10-CM | POA: Diagnosis not present

## 2023-12-28 DIAGNOSIS — Z30017 Encounter for initial prescription of implantable subdermal contraceptive: Secondary | ICD-10-CM

## 2023-12-28 DIAGNOSIS — Z113 Encounter for screening for infections with a predominantly sexual mode of transmission: Secondary | ICD-10-CM | POA: Diagnosis not present

## 2023-12-28 LAB — POCT PREGNANCY, URINE: Preg Test, Ur: NEGATIVE

## 2023-12-28 MED ORDER — ETONOGESTREL 68 MG ~~LOC~~ IMPL
68.0000 mg | DRUG_IMPLANT | Freq: Once | SUBCUTANEOUS | Status: AC
  Administered 2023-12-28: 68 mg via SUBCUTANEOUS

## 2023-12-28 NOTE — Addendum Note (Signed)
 Addended by: LENNIE RONCO T on: 12/28/2023 11:44 AM   Modules accepted: Orders

## 2023-12-28 NOTE — Progress Notes (Signed)
     GYNECOLOGY OFFICE PROCEDURE NOTE  Beth Goodwin is a 28 y.o. G2P1010 here for Nexplanon  removal and Nexplanon  insertion.  Last pap smear was on 02/2022 and was normal.  No other gynecologic concerns. UPT: negative  Reviewed risks of insertion of implant including risk of infection, bleeding, damage to surrounding tissues and organs, migration of implant, difficult removal or inability to remove in office. She verbalizes understanding and affirms desire to proceed. Consent signed.     Nexplanon  Removal and Insertion  Patient does understand that irregular bleeding is a very common side effect of this medication. She was advised to have backup contraception for one week after replacement of the implant. Pregnancy test in clinic today was negative.  Appropriate time out taken. Nexplanon  site identified. Area prepped in usual sterile fashon. Two ml of 1% lidocaine  was used to anesthetize the area at the distal end of the implant. A small stab incision was made right beside the implant on the distal portion. The Nexplanon  rod was grasped using hemostats and removed without difficulty. There was minimal blood loss. There were no complications. Area was then injected with 3 ml of 1 % lidocaine . She was re-prepped with betadine, Nexplanon  removed from packaging, Device confirmed in needle, then inserted full length of needle and withdrawn per handbook instructions. Nexplanon  was able to palpated in the patient's arm; patient palpated the insert herself.  There was minimal blood loss. Patient insertion site covered with gauze and a pressure bandage to reduce any bruising. The patient tolerated the procedure well and was given post procedure instructions.  She was advised to have backup contraception for one week.     1. Routine screening for STI (sexually transmitted infection) - Cervicovaginal ancillary only( [place]) - Hepatitis B surface antigen - Hepatitis C antibody  2. Nexplanon   insertion - Pregnancy, urine POC  3. Nexplanon  removal (Primary)   K. Yolanda Moats, MD, [facility] Attending Center for [facility])

## 2023-12-29 LAB — HEPATITIS C ANTIBODY: Hep C Virus Ab: NONREACTIVE

## 2023-12-29 LAB — CERVICOVAGINAL ANCILLARY ONLY
Chlamydia: NEGATIVE
Comment: NEGATIVE
Comment: NEGATIVE
Comment: NORMAL
Neisseria Gonorrhea: NEGATIVE
Trichomonas: POSITIVE — AB

## 2023-12-29 LAB — HEPATITIS B SURFACE ANTIGEN: Hepatitis B Surface Ag: NEGATIVE

## 2023-12-30 ENCOUNTER — Ambulatory Visit: Payer: Self-pay | Admitting: Obstetrics and Gynecology

## 2023-12-30 MED ORDER — METRONIDAZOLE 500 MG PO TABS: ORAL_TABLET | ORAL | 0 refills | Status: DC

## 2024-01-21 DIAGNOSIS — Z419 Encounter for procedure for purposes other than remedying health state, unspecified: Secondary | ICD-10-CM | POA: Diagnosis not present

## 2024-01-29 ENCOUNTER — Other Ambulatory Visit: Payer: Self-pay

## 2024-01-29 ENCOUNTER — Emergency Department (HOSPITAL_COMMUNITY)
Admission: EM | Admit: 2024-01-29 | Discharge: 2024-01-29 | Discharge status: Against Medical Advice | Attending: Emergency Medicine | Admitting: Emergency Medicine

## 2024-01-29 ENCOUNTER — Encounter (HOSPITAL_COMMUNITY): Payer: Self-pay | Admitting: Emergency Medicine

## 2024-01-29 DIAGNOSIS — N898 Other specified noninflammatory disorders of vagina: Secondary | ICD-10-CM | POA: Diagnosis not present

## 2024-01-29 DIAGNOSIS — Z5329 Procedure and treatment not carried out because of patient's decision for other reasons: Secondary | ICD-10-CM | POA: Diagnosis not present

## 2024-01-29 DIAGNOSIS — L292 Pruritus vulvae: Secondary | ICD-10-CM | POA: Diagnosis not present

## 2024-01-29 LAB — WET PREP, GENITAL
Clue Cells Wet Prep HPF POC: NONE SEEN
Sperm: NONE SEEN
Trich, Wet Prep: NONE SEEN
WBC, Wet Prep HPF POC: <10 (ref <10)
Yeast Wet Prep HPF POC: NONE SEEN

## 2024-01-29 NOTE — ED Triage Notes (Signed)
 Pt reports vaginal itching x 3 days. Reports trying anti-itch cream & monostat 1 day treatment w/ no relief. Denies burning w/ urination

## 2024-01-29 NOTE — ED Notes (Signed)
 Patient did not want to wait for pelvic/labs. Patient opted to self swab and left immediately after due to needing to get to work. Provider aware.

## 2024-01-29 NOTE — ED Provider Notes (Signed)
 [place] [facility]MENT AT [facility] Provider Note   CSN: [id number] Arrival date & time: 01/29/24  9471     Patient presents with: Vaginal Itching   Beth Goodwin is a 28 y.o. female.   28 year old female presenting with vaginal itching.  Patient reports that her symptoms began approximately 3 days ago, she has tried Monistat and anti-itch cream without relief of her symptoms.  She describes itching at the introitus, no dysuria, no fever, no abdominal pain, no pain with intercourse, no irregular vaginal bleeding/discharge/odor.  Patient recently completed a course of antibiotics for treatment of trichomonas.  She has a Nexplanon  in place for contraception.   Vaginal Itching       Prior to Admission medications   Medication Sig Start Date End Date Taking? Authorizing Provider  clindamycin  (CLEOCIN -T) 1 % lotion Apply topically daily. As needed for flares Patient not taking: Reported on 12/28/2023 06/01/23 05/31/24  Alm Delon SAILOR, DO  etonogestrel  (NEXPLANON ) 68 MG IMPL implant Inject into the skin.    [provider]  levocetirizine (XYZAL ) 5 MG tablet Take 1 tablet (5 mg total) by mouth every evening. 11/04/22   Luke Orlan HERO, DO  metFORMIN  (GLUCOPHAGE ) 500 MG tablet Take 1 tablet (500 mg total) by mouth 2 (two) times daily with a meal. 02/19/23   Wallace Joesph LABOR, PA  metroNIDAZOLE  (FLAGYL ) 500 MG tablet Take two tablets by mouth twice a day, for one day.  Or you can take all four tablets at once if you can tolerate it. 12/30/23   Nicholaus Burnard HERO, MD  montelukast  (SINGULAIR ) 10 MG tablet Take 1 tablet (10 mg total) by mouth at bedtime. 11/04/22   Luke Orlan HERO, DO  Semaglutide ,0.25 or 0.5MG /DOS, (OZEMPIC , 0.25 OR 0.5 MG/DOSE,) 2 MG/3ML SOPN Inject 0.25 mg into the skin once a week. 11/08/23   Gayle Saddie FALCON, PA-C  triamcinolone  ointment (KENALOG ) 0.1 % Apply 1 Application topically 2 (two) times daily as needed (rash flare). Do not use on the face, neck,  armpits or groin area. Do not use more than 3 weeks in a row. 11/04/22   Luke Orlan HERO, DO    Allergies: Amoxicillin    Review of Systems  Updated Vital Signs  Vitals:   01/29/24 0630 01/29/24 0645 01/29/24 0700 01/29/24 0715  BP: 111/75 109/80 118/89   Pulse: 82 87 86 88  Resp:      Temp:      TempSrc:      SpO2: 99% 100% 100% 100%     Physical Exam Vitals and nursing note reviewed.  HENT:     Head: Normocephalic.  Eyes:     Extraocular Movements: Extraocular movements intact.  Cardiovascular:     Rate and Rhythm: Normal rate and regular rhythm.     Heart sounds: Normal heart sounds.  Pulmonary:     Effort: Pulmonary effort is normal.     Breath sounds: Normal breath sounds.  Abdominal:     Palpations: Abdomen is soft.     Tenderness: There is no abdominal tenderness. There is no guarding.  Musculoskeletal:     Cervical back: Normal range of motion.     Right lower leg: No edema.     Left lower leg: No edema.     Comments: Moves all extremities spontaneously without difficulty  Skin:    General: Skin is warm and dry.  Neurological:     Mental Status: She is alert and oriented to person, place, and time.     (  all labs ordered are listed, but only abnormal results are displayed) Labs Reviewed  WET PREP, GENITAL  RAPID HIV SCREEN (HIV 1/2 AB+AG)  RPR  HCG, SERUM, QUALITATIVE  GC/CHLAMYDIA PROBE AMP ([place]) NOT AT [facility]    EKG: None  Radiology: No results found.   Procedures   Medications Ordered in the ED - No data to display                                  Medical Decision Making This patient presents to the ED for concern of vaginal itching, this involves an extensive number of treatment options, and is a complaint that carries with it a high risk of complications and morbidity.  The differential diagnosis includes vulvovaginal candidiasis, other rash, trichomonas/gonorrhea/chlamydia/bacterial vaginosis, HIV/syphilis   Co morbidities that  complicate the patient evaluation  Recently completed treatment for trichomonas   Additional history obtained:  Additional history obtained from record review External records from outside source obtained and reviewed including most recent OB/GYN note   Lab Tests:  I Ordered, and personally interpreted labs.  The pertinent results include: HIV/RPR pending at time of discharge.  Gonorrhea/chlamydia pending at time of discharge.  Wet prep negative.  Cardiac Monitoring: / EKG:  The patient was maintained on a cardiac monitor.  I personally viewed and interpreted the cardiac monitored which showed an underlying rhythm of: NSR   Problem List / ED Course / Critical interventions / Medication management  I have reviewed the patients home medicines and have made adjustments as needed   Social Determinants of Health:  Housing instability, financial instability   Test / Admission - Considered:  Physical exam is notable as above.  I offered that patient could obtain samples via self swab for testing of gonorrhea/chlamydia/wet prep, however she request a pelvic exam today, I feel that this is a reasonable plan.  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  I was made aware by nursing staff that the patient eloped from the emergency department, unfortunately this was done before the results of her wet prep were made available, unfortunately I was not able to speak with the patient again prior to her eloping from the emergency department today.  Wet prep results were negative as above.  Patient is an active MyChart user and will have access to these results.  She is established with OB/GYN.    Amount and/or Complexity of Data Reviewed Labs: ordered.        Final diagnoses:   Vaginal itching    ED Discharge Orders     None          Glendia Rocky SAILOR, [place] 01/29/24 1009    Levander Houston, MD 01/31/24 1213

## 2024-01-29 NOTE — ED Notes (Signed)
 Went to get vitals on pt. Pt stated she's been here since 5am. Asked if she would be out of here before 11am because that's what time she has to be at work, and she can't be late. When told there was a chance it will be after 11am before she could leave, she refused everything and stated that she needed to leave now. RN informed.

## 2024-01-31 LAB — GC/CHLAMYDIA PROBE AMP (~~LOC~~) NOT AT ARMC
Chlamydia: NEGATIVE
Comment: NEGATIVE
Comment: NORMAL
Neisseria Gonorrhea: NEGATIVE

## 2024-02-04 ENCOUNTER — Ambulatory Visit

## 2024-02-16 NOTE — H&P (Signed)
  Patient: Verlyn Dannenberg  PID: [id number]  DOB: 27-Jan-1996  SEX: Female   Patient referred by DDS for extraction teeth  CC: Painful wisdom teeth.  Past Medical History:  Diabetes, Snoring, Smoker, Weed, Morbid Obesity    Medications: Metformin , Nexplanon     Allergies:     Amoxicillin    Surgeries:   C Section, gallbladder removal     Social History       Smoking:            Alcohol: Drug use: mj                            Exam: BMI 48. Erupted 1, 16, 17, 32.   No purulence, edema, fluctuance, trismus. Oral cancer screening negative. Pharynx clear. No lymphadenopathy.  Panorex: Erupted 1, 16, 17, 32. Impacted # 4.   Assessment: ASA 3. Non-restorable teeth #  1, 16, 17, 32.              Plan: Extraction Teeth # 1, 16, 17, 32.     Hospital Day surgery.                 Rx: n               Risks and complications explained. Questions answered.   Glendia EMERSON Primrose, DMD

## 2024-02-17 ENCOUNTER — Encounter (HOSPITAL_COMMUNITY): Payer: Self-pay | Admitting: Oral Surgery

## 2024-02-17 ENCOUNTER — Other Ambulatory Visit: Payer: Self-pay

## 2024-02-17 NOTE — Anesthesia Preprocedure Evaluation (Signed)
 Anesthesia Evaluation  Patient identified by MRN, date of birth, ID band Patient awake    Reviewed: Allergy  & Precautions, NPO status , Patient's Chart, lab work & pertinent test results  Airway Mallampati: III  TM Distance: >3 FB Neck ROM: Full    Dental no notable dental hx. (+) Teeth Intact, Dental Advisory Given   Pulmonary neg pulmonary ROS, Patient abstained from smoking.   Pulmonary exam normal breath sounds clear to auscultation       Cardiovascular negative cardio ROS Normal cardiovascular exam Rhythm:Regular Rate:Normal     Neuro/Psych  PSYCHIATRIC DISORDERS Anxiety     negative neurological ROS     GI/Hepatic Neg liver ROS,GERD  ,,  Endo/Other  diabetes, Type 2, Oral Hypoglycemic Agents  Class 3 obesity (BMI 49)  Renal/GU negative Renal ROS  negative genitourinary   Musculoskeletal negative musculoskeletal ROS (+)    Abdominal   Peds  Hematology negative hematology ROS (+)   Anesthesia Other Findings   Reproductive/Obstetrics                              Anesthesia Physical Anesthesia Plan  ASA: 3  Anesthesia Plan: General   Post-op Pain Management: Tylenol  PO (pre-op)*   Induction: Intravenous  PONV Risk Score and Plan: 3 and Midazolam , Dexamethasone  and Ondansetron   Airway Management Planned: Nasal ETT and Video Laryngoscope Planned  Additional Equipment:   Intra-op Plan:   Post-operative Plan: Extubation in OR  Informed Consent: I have reviewed the patients History and Physical, chart, labs and discussed the procedure including the risks, benefits and alternatives for the proposed anesthesia with the patient or authorized representative who has indicated his/her understanding and acceptance.     Dental advisory given  Plan Discussed with: CRNA  Anesthesia Plan Comments:          Anesthesia Quick Evaluation

## 2024-02-17 NOTE — Progress Notes (Signed)
 PCP - Gayle Saddie FALCON, PA-C  Cardiologist -   PPM/ICD - denies Device Orders - n/a Rep Notified - n/a  Chest x-ray - denies EKG - DOS Stress Test - denies ECHO - denies Cardiac Cath - denies  CPAP - denies   GLP-1 -OZEMPIC  Last Dose 02-10-24. Instructed patient not to take medication today   DM - only checks blood sugar when she feels sluggish per patient.  Last A1c 6.5 on  11-04-23  Blood Thinner Instructions: denies Aspirin Instructions: denies  ERAS Protcol - NPO  COVID TEST- N/a  Anesthesia review: NO  Patient verbally denies any shortness of breath, fever, cough and chest pain during phone call   -------------  SDW INSTRUCTIONS given:  Your procedure is scheduled on February 18, 2024.  Report to [facility] Entrance A at 5:30 A.M., and check in at the Admitting office.  Call this number if you have problems the morning of surgery:  [phone]   Remember:  Do not eat or drink after midnight the night before your surgery     Take these medicines the morning of surgery with A SIP OF WATER  N/A   As of today, STOP taking any Aspirin (unless otherwise instructed by your surgeon) Aleve , Naproxen , Ibuprofen , Motrin , Advil , Goody's, BC's, all herbal medications, fish oil, and all vitamins.     WHAT DO I DO ABOUT MY DIABETES MEDICATION?   Do not take oral diabetes medicines metFORMIN  (GLUCOPHAGE ) the morning of surgery.  OZEMPIC  LAST DOSE  The day of surgery, do not take other diabetes injectables, including Byetta (exenatide), Bydureon (exenatide ER), Victoza (liraglutide), or Trulicity (dulaglutide).  If your CBG is greater than 220 mg/dL, you may take  of your sliding scale (correction) dose of insulin.   HOW TO MANAGE YOUR DIABETES BEFORE AND AFTER SURGERY  Why is it important to control my blood sugar before and after surgery? Improving blood sugar levels before and after surgery helps healing and can limit problems. A way of improving blood  sugar control is eating a healthy diet by:  Eating less sugar and carbohydrates  Increasing activity/exercise  Talking with your doctor about reaching your blood sugar goals High blood sugars (greater than 180 mg/dL) can raise your risk of infections and slow your recovery, so you will need to focus on controlling your diabetes during the weeks before surgery. Make sure that the doctor who takes care of your diabetes knows about your planned surgery including the date and location.  How do I manage my blood sugar before surgery? Check your blood sugar at least 4 times a day, starting 2 days before surgery, to make sure that the level is not too high or low.  Check your blood sugar the morning of your surgery when you wake up and every 2 hours until you get to the Short Stay unit.  If your blood sugar is less than 70 mg/dL, you will need to treat for low blood sugar: Do not take insulin. Treat a low blood sugar (less than 70 mg/dL) with  cup of clear juice (cranberry or apple), 4 glucose tablets, OR glucose gel. Recheck blood sugar in 15 minutes after treatment (to make sure it is greater than 70 mg/dL). If your blood sugar is not greater than 70 mg/dL on recheck, call [phone] for further instructions. Report your blood sugar to the short stay nurse when you get to Short Stay.  If you are admitted to the hospital after surgery: Your blood sugar will  be checked by the staff and you will probably be given insulin after surgery (instead of oral diabetes medicines) to make sure you have good blood sugar levels. The goal for blood sugar control after surgery is 80-180 mg/dL.                  Do not wear jewelry, make up, or nail polish            Do not wear lotions, powders, perfumes/colognes, or deodorant.            Do not shave 48 hours prior to surgery.  Men may shave face and neck.            Do not bring valuables to the hospital.            [facility] is not responsible for any  belongings or valuables.  Do NOT Smoke (Tobacco/Vaping) 24 hours prior to your procedure If you use a CPAP at night, you may bring all equipment for your overnight stay.   Contacts, glasses, dentures or bridgework may not be worn into surgery.      For patients admitted to the hospital, discharge time will be determined by your treatment team.   Patients discharged the day of surgery will not be allowed to drive home, and someone needs to stay with them for 24 hours.    Special instructions:   [place]- Preparing For Surgery  Before surgery, you can play an important role. Because skin is not sterile, your skin needs to be as free of germs as possible. You can reduce the number of germs on your skin by washing with CHG (chlorahexidine gluconate) Soap before surgery.  CHG is an antiseptic cleaner which kills germs and bonds with the skin to continue killing germs even after washing.    Oral Hygiene is also important to reduce your risk of infection.  Remember - BRUSH YOUR TEETH THE MORNING OF SURGERY WITH YOUR REGULAR TOOTHPASTE  Please do not use if you have an allergy  to CHG or antibacterial soaps. If your skin becomes reddened/irritated stop using the CHG.  Do not shave (including legs and underarms) for at least 48 hours prior to first CHG shower. It is OK to shave your face.  Please follow these instructions carefully.   Shower the NIGHT BEFORE SURGERY and the MORNING OF SURGERY with DIAL Soap.   Pat yourself dry with a CLEAN TOWEL.  Wear CLEAN PAJAMAS to bed the night before surgery  Place CLEAN SHEETS on your bed the night of your first shower and DO NOT SLEEP WITH PETS.   Day of Surgery: Please shower morning of surgery  Wear Clean/Comfortable clothing the morning of surgery Do not apply any deodorants/lotions.   Remember to brush your teeth WITH YOUR REGULAR TOOTHPASTE.   Questions were answered. Patient verbalized understanding of instructions.

## 2024-02-18 ENCOUNTER — Encounter (HOSPITAL_COMMUNITY): Payer: Self-pay | Admitting: Oral Surgery

## 2024-02-18 ENCOUNTER — Ambulatory Visit (HOSPITAL_COMMUNITY): Payer: Self-pay

## 2024-02-18 ENCOUNTER — Ambulatory Visit (HOSPITAL_BASED_OUTPATIENT_CLINIC_OR_DEPARTMENT_OTHER): Payer: Self-pay

## 2024-02-18 ENCOUNTER — Ambulatory Visit (HOSPITAL_COMMUNITY)
Admission: RE | Admit: 2024-02-18 | Discharge: 2024-02-18 | Disposition: A | Discharge status: Home / Self Care | Attending: Oral Surgery | Admitting: Oral Surgery

## 2024-02-18 ENCOUNTER — Other Ambulatory Visit: Payer: Self-pay

## 2024-02-18 ENCOUNTER — Encounter: Admission: RE | Disposition: A | Payer: Self-pay | Attending: Oral Surgery

## 2024-02-18 DIAGNOSIS — Z7984 Long term (current) use of oral hypoglycemic drugs: Secondary | ICD-10-CM | POA: Insufficient documentation

## 2024-02-18 DIAGNOSIS — K0850 Unsatisfactory restoration of tooth, unspecified: Secondary | ICD-10-CM | POA: Diagnosis not present

## 2024-02-18 DIAGNOSIS — K219 Gastro-esophageal reflux disease without esophagitis: Secondary | ICD-10-CM | POA: Insufficient documentation

## 2024-02-18 DIAGNOSIS — Z87891 Personal history of nicotine dependence: Secondary | ICD-10-CM | POA: Insufficient documentation

## 2024-02-18 DIAGNOSIS — K0889 Other specified disorders of teeth and supporting structures: Secondary | ICD-10-CM | POA: Insufficient documentation

## 2024-02-18 DIAGNOSIS — Z6841 Body Mass Index (BMI) 40.0 and over, adult: Secondary | ICD-10-CM | POA: Diagnosis not present

## 2024-02-18 DIAGNOSIS — E119 Type 2 diabetes mellitus without complications: Secondary | ICD-10-CM | POA: Insufficient documentation

## 2024-02-18 HISTORY — PX: TOOTH EXTRACTION: SHX859

## 2024-02-18 LAB — CBC
HCT: 40.7 % (ref 36.0–46.0)
Hemoglobin: 12.8 g/dL (ref 12.0–15.0)
MCH: 27.9 pg (ref 26.0–34.0)
MCHC: 31.4 g/dL (ref 30.0–36.0)
MCV: 88.7 fL (ref 80.0–100.0)
Platelets: 343 K/uL (ref 150–400)
RBC: 4.59 MIL/uL (ref 3.87–5.11)
RDW: 13.3 % (ref 11.5–15.5)
WBC: 11.2 K/uL — ABNORMAL HIGH (ref 4.0–10.5)
nRBC: 0 % (ref 0.0–0.2)

## 2024-02-18 LAB — GLUCOSE, CAPILLARY
Glucose-Capillary: 137 mg/dL — ABNORMAL HIGH (ref 70–99)
Glucose-Capillary: 198 mg/dL — ABNORMAL HIGH (ref 70–99)

## 2024-02-18 LAB — BASIC METABOLIC PANEL WITH GFR
Anion gap: 12 (ref 5–15)
BUN: 7 mg/dL (ref 6–20)
CO2: 21 mmol/L — ABNORMAL LOW (ref 22–32)
Calcium: 8.7 mg/dL — ABNORMAL LOW (ref 8.9–10.3)
Chloride: 103 mmol/L (ref 98–111)
Creatinine, Ser: 0.57 mg/dL (ref 0.44–1.00)
GFR, Estimated: >60 mL/min (ref >60)
Glucose, Bld: 120 mg/dL — ABNORMAL HIGH (ref 70–99)
Potassium: 3.8 mmol/L (ref 3.5–5.1)
Sodium: 136 mmol/L (ref 135–145)

## 2024-02-18 LAB — POCT PREGNANCY, URINE: Preg Test, Ur: NEGATIVE

## 2024-02-18 SURGERY — DENTAL RESTORATION/EXTRACTIONS
Anesthesia: General

## 2024-02-18 MED ORDER — FENTANYL CITRATE (PF) 100 MCG/2ML IJ SOLN
INTRAMUSCULAR | Status: DC | PRN
  Administered 2024-02-18: 100 ug via INTRAVENOUS
  Administered 2024-02-18: 50 ug via INTRAVENOUS

## 2024-02-18 MED ORDER — ROCURONIUM BROMIDE 100 MG/10ML IV SOLN
INTRAVENOUS | Status: DC | PRN
  Administered 2024-02-18: 60 mg via INTRAVENOUS

## 2024-02-18 MED ORDER — AMISULPRIDE (ANTIEMETIC) 5 MG/2ML IV SOLN
INTRAVENOUS | Status: AC
  Filled 2024-02-18: qty 4

## 2024-02-18 MED ORDER — ALBUTEROL SULFATE HFA 108 (90 BASE) MCG/ACT IN AERS
INHALATION_SPRAY | RESPIRATORY_TRACT | Status: AC
  Filled 2024-02-18: qty 6.7

## 2024-02-18 MED ORDER — ALBUTEROL SULFATE HFA 108 (90 BASE) MCG/ACT IN AERS
INHALATION_SPRAY | RESPIRATORY_TRACT | Status: DC | PRN
  Administered 2024-02-18: 4 via RESPIRATORY_TRACT

## 2024-02-18 MED ORDER — CLINDAMYCIN HCL 300 MG PO CAPS: 300.0000 mg | ORAL_CAPSULE | Freq: Three times a day (TID) | ORAL | 0 refills | Status: DC

## 2024-02-18 MED ORDER — LIDOCAINE 2% (20 MG/ML) 5 ML SYRINGE
INTRAMUSCULAR | Status: DC | PRN
  Administered 2024-02-18: 100 mg via INTRAVENOUS

## 2024-02-18 MED ORDER — OXYCODONE HCL 5 MG PO TABS: 5.0000 mg | ORAL_TABLET | Freq: Once | ORAL | Status: DC | PRN

## 2024-02-18 MED ORDER — PROPOFOL 10 MG/ML IV BOLUS
INTRAVENOUS | Status: AC
  Filled 2024-02-18: qty 20

## 2024-02-18 MED ORDER — OXYMETAZOLINE HCL 0.05 % NA SOLN
NASAL | Status: AC
Start: 2024-02-18 — End: 2024-02-18
  Filled 2024-02-18: qty 30

## 2024-02-18 MED ORDER — ONDANSETRON HCL 4 MG/2ML IJ SOLN
INTRAMUSCULAR | Status: AC
  Filled 2024-02-18: qty 2

## 2024-02-18 MED ORDER — SODIUM CHLORIDE 0.9 % IR SOLN
Status: DC | PRN
Start: 2024-02-18 — End: 2024-02-18
  Administered 2024-02-18: 50 mL

## 2024-02-18 MED ORDER — 0.9 % SODIUM CHLORIDE (POUR BTL) OPTIME
TOPICAL | Status: DC | PRN
  Administered 2024-02-18: 1000 mL

## 2024-02-18 MED ORDER — LIDOCAINE-EPINEPHRINE 2 %-1:100000 IJ SOLN
INTRAMUSCULAR | Status: AC
  Filled 2024-02-18: qty 1

## 2024-02-18 MED ORDER — DEXAMETHASONE SOD PHOSPHATE PF 10 MG/ML IJ SOLN
INTRAMUSCULAR | Status: DC | PRN
  Administered 2024-02-18: 10 mg via INTRAVENOUS

## 2024-02-18 MED ORDER — LIDOCAINE-EPINEPHRINE 1 %-1:100000 IJ SOLN
INTRAMUSCULAR | Status: AC
Start: 2024-02-18 — End: 2024-02-18
  Filled 2024-02-18: qty 1

## 2024-02-18 MED ORDER — LIDOCAINE 2% (20 MG/ML) 5 ML SYRINGE
INTRAMUSCULAR | Status: AC
  Filled 2024-02-18: qty 5

## 2024-02-18 MED ORDER — MIDAZOLAM HCL 2 MG/2ML IJ SOLN
INTRAMUSCULAR | Status: DC | PRN
  Administered 2024-02-18: 2 mg via INTRAVENOUS

## 2024-02-18 MED ORDER — MIDAZOLAM HCL 2 MG/2ML IJ SOLN
INTRAMUSCULAR | Status: AC
  Filled 2024-02-18: qty 2

## 2024-02-18 MED ORDER — LACTATED RINGERS IV SOLN: INTRAVENOUS | Status: DC

## 2024-02-18 MED ORDER — PHENYLEPHRINE 80 MCG/ML (10ML) SYRINGE FOR IV PUSH (FOR BLOOD PRESSURE SUPPORT)
PREFILLED_SYRINGE | INTRAVENOUS | Status: AC
  Filled 2024-02-18: qty 20

## 2024-02-18 MED ORDER — ROCURONIUM BROMIDE 10 MG/ML (PF) SYRINGE
PREFILLED_SYRINGE | INTRAVENOUS | Status: AC
  Filled 2024-02-18: qty 10

## 2024-02-18 MED ORDER — OXYCODONE HCL 5 MG/5ML PO SOLN: 5.0000 mg | Freq: Once | ORAL | Status: DC | PRN

## 2024-02-18 MED ORDER — ONDANSETRON HCL 4 MG/2ML IJ SOLN
INTRAMUSCULAR | Status: DC | PRN
Start: 2024-02-18 — End: 2024-02-18
  Administered 2024-02-18: 4 mg via INTRAVENOUS

## 2024-02-18 MED ORDER — HYDROCODONE-ACETAMINOPHEN 5-325 MG PO TABS: 1.0000 | ORAL_TABLET | ORAL | 0 refills | Status: AC | PRN

## 2024-02-18 MED ORDER — CHLORHEXIDINE GLUCONATE 0.12 % MT SOLN
15.0000 mL | Freq: Once | OROMUCOSAL | Status: AC
  Administered 2024-02-18: 15 mL via OROMUCOSAL
  Filled 2024-02-18: qty 15

## 2024-02-18 MED ORDER — INSULIN ASPART 100 UNIT/ML IJ SOLN: 0.0000 [IU] | INTRAMUSCULAR | Status: DC | PRN

## 2024-02-18 MED ORDER — ORAL CARE MOUTH RINSE: 15.0000 mL | Freq: Once | OROMUCOSAL | Status: AC

## 2024-02-18 MED ORDER — PROPOFOL 10 MG/ML IV BOLUS
INTRAVENOUS | Status: DC | PRN
  Administered 2024-02-18: 200 mg via INTRAVENOUS

## 2024-02-18 MED ORDER — KETOROLAC TROMETHAMINE 30 MG/ML IJ SOLN
INTRAMUSCULAR | Status: DC | PRN
  Administered 2024-02-18: 30 mg via INTRAVENOUS

## 2024-02-18 MED ORDER — AMISULPRIDE (ANTIEMETIC) 5 MG/2ML IV SOLN
10.0000 mg | Freq: Once | INTRAVENOUS | Status: AC | PRN
  Administered 2024-02-18: 10 mg via INTRAVENOUS

## 2024-02-18 MED ORDER — LIDOCAINE-EPINEPHRINE 2 %-1:100000 IJ SOLN
INTRAMUSCULAR | Status: DC | PRN
  Administered 2024-02-18: 20 mL

## 2024-02-18 MED ORDER — FENTANYL CITRATE (PF) 100 MCG/2ML IJ SOLN: 25.0000 ug | INTRAMUSCULAR | Status: DC | PRN

## 2024-02-18 MED ORDER — PHENYLEPHRINE HCL (PRESSORS) 10 MG/ML IV SOLN
INTRAVENOUS | Status: DC | PRN
Start: 2024-02-18 — End: 2024-02-18
  Administered 2024-02-18: 80 ug via INTRAVENOUS
  Administered 2024-02-18: 120 ug via INTRAVENOUS

## 2024-02-18 MED ORDER — CLINDAMYCIN PHOSPHATE 900 MG/50ML IV SOLN
900.0000 mg | INTRAVENOUS | Status: AC
  Administered 2024-02-18: 900 mg via INTRAVENOUS
  Filled 2024-02-18: qty 50

## 2024-02-18 MED ORDER — FENTANYL CITRATE (PF) 250 MCG/5ML IJ SOLN
INTRAMUSCULAR | Status: AC
  Filled 2024-02-18: qty 5

## 2024-02-18 MED ORDER — IBUPROFEN 800 MG PO TABS: 800.0000 mg | ORAL_TABLET | Freq: Three times a day (TID) | ORAL | 0 refills | Status: AC | PRN

## 2024-02-18 MED ORDER — OXYMETAZOLINE HCL 0.05 % NA SOLN
NASAL | Status: DC | PRN
  Administered 2024-02-18: 2 via NASAL

## 2024-02-18 MED ORDER — SUGAMMADEX SODIUM 200 MG/2ML IV SOLN
INTRAVENOUS | Status: DC | PRN
  Administered 2024-02-18: 200 mg via INTRAVENOUS

## 2024-02-18 MED ORDER — ACETAMINOPHEN 500 MG PO TABS
1000.0000 mg | ORAL_TABLET | Freq: Once | ORAL | Status: AC
  Administered 2024-02-18: 1000 mg via ORAL
  Filled 2024-02-18: qty 2

## 2024-02-18 SURGICAL SUPPLY — 27 items
BAG COUNTER SPONGE SURGICOUNT (BAG) IMPLANT
BLADE SURG 15 STRL LF DISP TIS (BLADE) ×1 IMPLANT
BUR CROSS CUT FISSURE 1.6 (BURR) ×1 IMPLANT
BUR EGG ELITE 4.0 (BURR) IMPLANT
CANISTER SUCTION 3000ML PPV (SUCTIONS) ×1 IMPLANT
COVER SURGICAL LIGHT HANDLE (MISCELLANEOUS) ×1 IMPLANT
GAUZE PACKING FOLDED 2 STR (GAUZE/BANDAGES/DRESSINGS) ×1 IMPLANT
GLOVE BIO SURGEON STRL SZ8 (GLOVE) ×1 IMPLANT
GOWN STRL REUS W/ TWL LRG LVL3 (GOWN DISPOSABLE) ×1 IMPLANT
GOWN STRL REUS W/ TWL XL LVL3 (GOWN DISPOSABLE) ×1 IMPLANT
IV 0.9% NACL 1000 ML (IV SOLUTION) ×1 IMPLANT
KIT BASIN OR (CUSTOM PROCEDURE TRAY) ×1 IMPLANT
KIT TURNOVER KIT B (KITS) ×1 IMPLANT
NDL HYPO 25GX1X1/2 BEV (NEEDLE) ×2 IMPLANT
NEEDLE HYPO 25GX1X1/2 BEV (NEEDLE) ×2 IMPLANT
PAD ARMBOARD POSITIONER FOAM (MISCELLANEOUS) ×1 IMPLANT
SLEEVE IRRIGATION ELITE 7 (MISCELLANEOUS) ×1 IMPLANT
SOLN 0.9% NACL 1000 ML (IV SOLUTION) ×1 IMPLANT
SOLN 0.9% NACL POUR BTL 1000ML (IV SOLUTION) ×1 IMPLANT
SPIKE FLUID TRANSFER (MISCELLANEOUS) IMPLANT
SUT CHROMIC 3 0 SH 27 (SUTURE) IMPLANT
SUT PLAIN 3 0 PS2 27 (SUTURE) IMPLANT
SYR BULB IRRIG 60ML STRL (SYRINGE) ×1 IMPLANT
SYR CONTROL 10ML LL (SYRINGE) ×1 IMPLANT
TRAY ENT MC OR (CUSTOM PROCEDURE TRAY) ×1 IMPLANT
TUBING IRRIGATION (MISCELLANEOUS) ×1 IMPLANT
YANKAUER SUCT BULB TIP NO VENT (SUCTIONS) ×1 IMPLANT

## 2024-02-18 NOTE — H&P (Signed)
 H&P documentation  -History and Physical Reviewed  -Patient has been re-examined  -No change in the plan of care  Beth Goodwin

## 2024-02-18 NOTE — Anesthesia Procedure Notes (Addendum)
 Procedure Name: Intubation Date/Time: 02/18/2024 7:50 AM  Performed by: Danette Weinfeld, CRNAPre-anesthesia Checklist: Patient identified, Emergency Drugs available, Suction available and Patient being monitored Patient Re-evaluated:Patient Re-evaluated prior to induction Oxygen Delivery Method: Circle System Utilized Preoxygenation: Pre-oxygenation with 100% oxygen Induction Type: IV induction Ventilation: Mask ventilation without difficulty and Oral airway inserted - appropriate to patient size Laryngoscope Size: McGrath and 3 Grade View: Grade I Tube type: Oral Tube size: 6.5 mm Number of attempts: 2 (First attempt with 6.5 nasal ETT, difficulty placing the tube through vocal cords with glidescope grade 1 view, friable tissue despite Afrin spray and NPA dilation. Masked in between attempts and switched to oral ETT.) Airway Equipment and Method: Stylet and Oral airway Placement Confirmation: ETT inserted through vocal cords under direct vision, positive ETCO2 and breath sounds checked- equal and bilateral Tube secured with: Tape Dental Injury: Teeth and Oropharynx as per pre-operative assessment  Comments: Head and neck midline. Lips and teeth unchanged.

## 2024-02-18 NOTE — Op Note (Signed)
 02/18/2024  8:22 AM  PATIENT:  Beth Goodwin  28 y.o. female  PRE-OPERATIVE DIAGNOSIS:  NON RESTORABLE TEETH # 1, 16, 17, 32  POST-OPERATIVE DIAGNOSIS:  SAME  PROCEDURE:  Procedure(s): DENTAL RESTORATION/EXTRACTION TEETH # 1, 16, 17, 32  SURGEON:  Surgeon(s): Sheryle, Acupuncturist, DMD  ANESTHESIA:   local and general  EBL:  minimal  DRAINS: none   SPECIMEN:  No Specimen  COUNTS:  YES  PLAN OF CARE: Discharge to home after PACU  PATIENT DISPOSITION:  PACU - hemodynamically stable.   PROCEDURE DETAILS: Dictation # [id number]  Beth Goodwin Sheryle, DMD 02/18/2024 8:22 AM

## 2024-02-18 NOTE — Op Note (Signed)
 Goodwin, Goodwin MEDICAL RECORD NO: [id number] ACCOUNT NO: [id number] DATE OF BIRTH: 1995-09-26 FACILITY: [facility] LOCATION: [facility] PHYSICIAN: Glendia EMERSON Primrose, DDS  Operative Report   DATE OF PROCEDURE: 02/18/2024  PREOPERATIVE DIAGNOSES: 1. Nonrestorable teeth #1, #16, #17, and #32. 2. Morbid obesity.  POSTOPERATIVE DIAGNOSES: 1. Nonrestorable teeth #1, #16, #17, and #32. 2. Morbid obesity.  PROCEDURE: Extraction of teeth #1, #16, #17, and #32.  SURGEON: Glendia EMERSON Primrose, DDS  ANESTHESIA: General oral intubation, Dr. Niels attending.  DESCRIPTION OF PROCEDURE: The patient was taken to the operating room and placed on the table in the supine position. General anesthesia was administered. Attempts were made for nasal intubation and were unsuccessful. An oral endotracheal tube was placed  and secured, and the eyes were protected. The patient was draped and then a timeout was performed. The posterior pharynx was suctioned and a throat pack was placed. 2% lidocaine  1:100,000 epinephrine  was infiltrated. An inferior alveolar block on the  right and left side and buccal and palatal infiltration of the maxilla around the teeth to be removed. The left side was operated first. A #15 blade was used to make an incision in the gingival sulcus around tooth #17. The periosteum was reflected. The  Stryker handpiece was used to remove interproximal bone between teeth #17 and #18. The tooth was elevated using a 301 elevator and then was further elevated with the dental forceps and eventually the tooth was removed with the dental forceps. The socket  was curetted and irrigated and closed with #3-0 chromic. Then, the incision was made around tooth #16 in the gingival sulcus. The periosteum was reflected with a periosteal elevator. The 301 elevator was used to elevate the tooth and the upper universal  forceps was used to extract the tooth. The socket was curetted and irrigated. No suture was  required. Then, the throat pack was removed. The endotracheal tube was repositioned to the other side and re-secured. A new throat pack was placed and then a #15  blade was used to make an incision around tooth #32 and around tooth #1 in the gingival sulcus. The periosteum was reflected. The bone was removed in the interproximal space between #31 and #32. Then the tooth was elevated with a 301 elevator and removed  from the mouth with dental forceps. The root fractured upon attempted removal, necessitating removal of interproximal bone within tooth #32 until the root fragment could be removed with the root tip pick. Then, the socket was irrigated and closed with  #3-0 chromic. Then, the maxillary tooth #1 was elevated with a 301 elevator and removed from the mouth with the dental forceps. The socket was curetted and irrigated and no suture was necessary. Then, the oral cavity was irrigated and suctioned. The  throat pack was removed. The patient was left in care of anesthesia for extubation and transported to the recovery room with plans for discharge through day surgery.  ESTIMATED BLOOD LOSS: Minimal.  COMPLICATIONS: None.  SPECIMENS: None.  COUNTS: Correct.    [email or web address] D: 02/18/2024 8:26:40 am T: 02/18/2024 8:31:00 am  JOB: [id number]/ [id number]

## 2024-02-18 NOTE — Transfer of Care (Signed)
 Immediate Anesthesia Transfer of Care Note  Patient: Beth Goodwin  Procedure(s) Performed: DENTAL RESTORATION/EXTRACTIONS NUMBER ONE, SIXTEEN, SEVENTEEN, AND THIRTY TWO  Patient Location: PACU  Anesthesia Type:General  Level of Consciousness: awake, alert , and oriented  Airway & Oxygen Therapy: Patient Spontanous Breathing and Patient connected to face mask oxygen  Post-op Assessment: Report given to RN and Post -op Vital signs reviewed and stable  Post vital signs: Reviewed and stable  Last Vitals:  Vitals Value Taken Time  BP    Temp    Pulse 99 02/18/24 08:43  Resp 25 02/18/24 08:43  SpO2 88 % 02/18/24 08:43  Vitals shown include unfiled device data.  Last Pain:  Vitals:   02/18/24 0837  TempSrc:   PainSc: 0-No pain         Complications: No notable events documented.

## 2024-02-18 NOTE — Anesthesia Postprocedure Evaluation (Signed)
 Anesthesia Post Note  Patient: Beth Goodwin  Procedure(s) Performed: DENTAL RESTORATION/EXTRACTIONS NUMBER ONE, SIXTEEN, SEVENTEEN, AND THIRTY TWO     Patient location during evaluation: PACU Anesthesia Type: General Level of consciousness: awake and alert Pain management: pain level controlled Vital Signs Assessment: post-procedure vital signs reviewed and stable Respiratory status: spontaneous breathing, nonlabored ventilation, respiratory function stable and patient connected to nasal cannula oxygen Cardiovascular status: blood pressure returned to baseline and stable Postop Assessment: no apparent nausea or vomiting Anesthetic complications: no   No notable events documented.  Last Vitals:  Vitals:   02/18/24 0900 02/18/24 0910  BP: 120/66 120/65  Pulse: 92 90  Resp: 20 19  Temp:  36.8 C  SpO2: 94% 94%    Last Pain:  Vitals:   02/18/24 0910  TempSrc:   PainSc: 0-No pain                 Beth Goodwin

## 2024-02-19 ENCOUNTER — Encounter (HOSPITAL_COMMUNITY): Payer: Self-pay | Admitting: Oral Surgery

## 2024-02-20 DIAGNOSIS — Z419 Encounter for procedure for purposes other than remedying health state, unspecified: Secondary | ICD-10-CM | POA: Diagnosis not present

## 2024-03-01 ENCOUNTER — Ambulatory Visit (INDEPENDENT_AMBULATORY_CARE_PROVIDER_SITE_OTHER)

## 2024-03-01 VITALS — BP 114/79 | HR 95 | Ht 64.0 in | Wt 281.0 lb

## 2024-03-01 DIAGNOSIS — E119 Type 2 diabetes mellitus without complications: Secondary | ICD-10-CM

## 2024-03-01 DIAGNOSIS — M1380 Other specified arthritis, unspecified site: Secondary | ICD-10-CM | POA: Insufficient documentation

## 2024-03-01 DIAGNOSIS — Z6841 Body Mass Index (BMI) 40.0 and over, adult: Secondary | ICD-10-CM | POA: Diagnosis not present

## 2024-03-01 LAB — POCT GLYCOSYLATED HEMOGLOBIN (HGB A1C): Hemoglobin A1C: 6.3 % — AB (ref 4.0–5.6)

## 2024-03-01 MED ORDER — OZEMPIC (0.25 OR 0.5 MG/DOSE) 2 MG/3ML ~~LOC~~ SOPN
0.5000 mg | PEN_INJECTOR | SUBCUTANEOUS | 2 refills | Status: AC
Start: 2024-03-01 — End: ?

## 2024-03-01 MED ORDER — MONTELUKAST SODIUM 10 MG PO TABS: 10.0000 mg | ORAL_TABLET | Freq: Every day | ORAL | 2 refills | Status: AC

## 2024-03-01 MED ORDER — LEVOCETIRIZINE DIHYDROCHLORIDE 5 MG PO TABS: 5.0000 mg | ORAL_TABLET | Freq: Every evening | ORAL | 2 refills | Status: AC

## 2024-03-01 MED ORDER — METFORMIN HCL 500 MG PO TABS
500.0000 mg | ORAL_TABLET | Freq: Two times a day (BID) | ORAL | 1 refills | Status: AC
Start: 2024-03-01 — End: ?

## 2024-03-01 NOTE — Patient Instructions (Signed)
 VISIT SUMMARY: You had a follow-up visit to manage your type 2 diabetes and discuss other health concerns. Your diabetes is well-controlled with an A1c of 6.3%, and you have made significant dietary changes. We also addressed your gastrointestinal symptoms and medication concerns.  YOUR PLAN: TYPE 2 DIABETES MELLITUS: Your diabetes is well-controlled with an A1c of 6.3%. You have made significant dietary changes and reduced snacking. -Increase Ozempic  to 0.5 mg after finishing your current supply. -Refill metformin . -Continue using probiotics and consider MiraLAX  for constipation. -Do not reuse needles; we will send Ozempic  as a 90-day supply. -Follow-up in six months with lab tests including cholesterol, thyroid , kidneys, liver, and A1c one week prior to the visit.  ALLERGIC RHINITIS: You requested refills for Singulair  and Montelukast , which are effective for your allergy  symptoms. -Refill Singulair  and Montelukast .  If you have any problems before your next visit feel free to message me via MyChart (minor issues or questions) or call the office, otherwise you may reach out to schedule an office visit.  Thank you! Saddie Sacks, PA-C

## 2024-03-01 NOTE — Assessment & Plan Note (Signed)
 A1c now 6.3. Improved diet and reduced snacking noted. Initial constipation managed with probiotics. Discussed Ozempic 's mechanism causing fullness and potential constipation. - Congratulated patient on her efforts - Increase Ozempic  to 0.5 mg post current supply. - Refill metformin . - Recommend probiotics and MiraLAX  for constipation. - Advise against needle reuse; send Ozempic  as 90-day supply. - Follow-up in six months with labs including cholesterol, thyroid , kidneys, liver, and A1c one week prior.

## 2024-03-01 NOTE — Progress Notes (Signed)
 Established Patient Office Visit  Subjective   Patient ID: Beth Goodwin, female    DOB: February 15, 1996  Age: 28 y.o. MRN: [id number]  Chief Complaint  Patient presents with   Medical Management of Chronic Issues    HPI  History of Present Illness   Beth Goodwin is a 28 year old female with type 2 diabetes who presents for follow-up on her diabetes management.  Type 2 diabetes mellitus management - Currently taking Ozempic  0.25 mg for approximately four months - Still doing Metformin  and tolerating well. Does need a refill.  - A1c recently measured at 6.3% - Significant dietary changes and reduced urge to snack - No significant weight change on the scale, but clothes fit better - Adhering to medication regimen and dietary modifications  Gastrointestinal symptoms - Constipation since starting Ozempic  - Initial concern about constipation, improved with daily probiotic use as suggested by her father - Constipation symptoms have resolved with probiotics  Allergic rhinitis/asthma management - Requests refills for Singulair  and Montelukast , which are effective for her allergy  symptoms  Preventive care - Has not had an eye exam this year - Declines flu shot         ROS Per HPI.    Objective:     BP 114/79   Pulse 95   Ht 5' 4 (1.626 m)   Wt 281 lb (127.5 kg)   LMP 01/02/2024 (Approximate) Comment: UPT neg on 02/18/2024  SpO2 100%   BMI 48.23 kg/m    Physical Exam Constitutional:      General: She is not in acute distress.    Appearance: Normal appearance.  Cardiovascular:     Rate and Rhythm: Normal rate and regular rhythm.     Heart sounds: Normal heart sounds. No murmur heard.    No friction rub. No gallop.  Pulmonary:     Effort: Pulmonary effort is normal. No respiratory distress.     Breath sounds: Normal breath sounds.  Musculoskeletal:        General: No swelling.     Cervical back: Neck supple.  Lymphadenopathy:     Cervical: No  cervical adenopathy.  Skin:    General: Skin is warm and dry.  Neurological:     General: No focal deficit present.     Mental Status: She is alert.  Psychiatric:        Mood and Affect: Mood normal.        Behavior: Behavior normal.        Thought Content: Thought content normal.      Results for orders placed or performed in visit on 03/01/24  POCT HgB A1C  Result Value Ref Range   Hemoglobin A1C 6.3 (A) 4.0 - 5.6 %   HbA1c POC (<> result, manual entry)     HbA1c, POC (prediabetic range)     HbA1c, POC (controlled diabetic range)        The ASCVD Risk score (Arnett DK, et al., 2019) failed to calculate for the following reasons:   The 2019 ASCVD risk score is only valid for ages 96 to 69    Assessment & Plan:   Diabetes mellitus without complication (HCC) Assessment & Plan: A1c now 6.3. Improved diet and reduced snacking noted. Initial constipation managed with probiotics. Discussed Ozempic 's mechanism causing fullness and potential constipation. - Congratulated patient on her efforts - Increase Ozempic  to 0.5 mg post current supply. - Refill metformin . - Recommend probiotics and MiraLAX  for constipation. - Advise against needle reuse;  send Ozempic  as 90-day supply. - Follow-up in six months with labs including cholesterol, thyroid , kidneys, liver, and A1c one week prior.  Orders: -     POCT glycosylated hemoglobin (Hb A1C) -     Ozempic  (0.25 or 0.5 MG/DOSE); Inject 0.5 mg into the skin once a week.  Dispense: 3 mL; Refill: 2 -     metFORMIN  HCl; Take 1 tablet (500 mg total) by mouth 2 (two) times daily with a meal.  Dispense: 180 tablet; Refill: 1 -     Hemoglobin A1c; Future -     Comprehensive metabolic panel with GFR; Future -     Microalbumin / creatinine urine ratio; Future  BMI 45.0-49.9, adult (HCC) -     VITAMIN D  25 Hydroxy (Vit-D Deficiency, Fractures); Future -     TSH; Future -     Hemoglobin A1c; Future -     Lipid panel; Future -      Comprehensive metabolic panel with GFR; Future -     CBC with Differential/Platelet; Future -     Microalbumin / creatinine urine ratio; Future  Allergic arthritis Assessment & Plan: - Refill Singulair  and Montelukast .  - Symptoms well controlled.   Other orders -     Montelukast  Sodium; Take 1 tablet (10 mg total) by mouth at bedtime.  Dispense: 90 tablet; Refill: 2 -     Levocetirizine Dihydrochloride ; Take 1 tablet (5 mg total) by mouth every evening.  Dispense: 90 tablet; Refill: 2    Return in about 6 months (around 08/30/2024) for DM.    Beth JULIANNA Sacks, PA-C

## 2024-03-01 NOTE — Assessment & Plan Note (Signed)
-   Refill Singulair  and Montelukast .  - Symptoms well controlled.

## 2024-03-24 DIAGNOSIS — N611 Abscess of the breast and nipple: Secondary | ICD-10-CM | POA: Diagnosis not present

## 2024-03-24 DIAGNOSIS — L03319 Cellulitis of trunk, unspecified: Secondary | ICD-10-CM | POA: Diagnosis not present

## 2024-04-13 ENCOUNTER — Emergency Department (HOSPITAL_COMMUNITY)
Admission: EM | Admit: 2024-04-13 | Discharge: 2024-04-14 | Disposition: A | Attending: Emergency Medicine | Admitting: Emergency Medicine

## 2024-04-13 ENCOUNTER — Other Ambulatory Visit: Payer: Self-pay

## 2024-04-13 ENCOUNTER — Emergency Department (HOSPITAL_COMMUNITY)

## 2024-04-13 DIAGNOSIS — Z7984 Long term (current) use of oral hypoglycemic drugs: Secondary | ICD-10-CM | POA: Diagnosis not present

## 2024-04-13 DIAGNOSIS — E119 Type 2 diabetes mellitus without complications: Secondary | ICD-10-CM | POA: Insufficient documentation

## 2024-04-13 DIAGNOSIS — J020 Streptococcal pharyngitis: Secondary | ICD-10-CM | POA: Insufficient documentation

## 2024-04-13 DIAGNOSIS — R059 Cough, unspecified: Secondary | ICD-10-CM | POA: Diagnosis not present

## 2024-04-13 LAB — RESP PANEL BY RT-PCR (RSV, FLU A&B, COVID)  RVPGX2
Influenza A by PCR: NEGATIVE
Influenza B by PCR: NEGATIVE
Resp Syncytial Virus by PCR: NEGATIVE
SARS Coronavirus 2 by RT PCR: NEGATIVE

## 2024-04-13 LAB — GROUP A STREP BY PCR: Group A Strep by PCR: DETECTED — AB

## 2024-04-13 NOTE — ED Triage Notes (Signed)
 Patient c/o cough and URI x 2 days. Patient report tylenol  and Theraflu at home. Patient denies chest pain and SOB. Patient denies fever and chills.

## 2024-04-14 MED ORDER — CLINDAMYCIN HCL 300 MG PO CAPS
300.0000 mg | ORAL_CAPSULE | Freq: Once | ORAL | Status: AC
  Administered 2024-04-14: 300 mg via ORAL
  Filled 2024-04-14: qty 1

## 2024-04-14 MED ORDER — ACETAMINOPHEN 500 MG PO TABS
1000.0000 mg | ORAL_TABLET | Freq: Once | ORAL | Status: AC
  Administered 2024-04-14: 1000 mg via ORAL
  Filled 2024-04-14: qty 2

## 2024-04-14 MED ORDER — DEXAMETHASONE 10 MG/ML FOR PEDIATRIC ORAL USE
10.0000 mg | Freq: Once | INTRAMUSCULAR | Status: AC
  Administered 2024-04-14: 10 mg via ORAL

## 2024-04-14 MED ORDER — CLINDAMYCIN HCL 300 MG PO CAPS: 300.0000 mg | ORAL_CAPSULE | Freq: Three times a day (TID) | ORAL | 0 refills | Status: AC

## 2024-04-14 NOTE — ED Provider Notes (Signed)
 [place] EMERGENCY DEPARTMENT AT [facility] Provider Note  CSN: [id number] Arrival date & time: 04/13/24 2144  Chief Complaint(s) URI  HPI Beth Goodwin is a 28 y.o. female     URI Presenting symptoms: sore throat   Severity:  Moderate Onset quality:  Gradual Duration:  2 days Timing:  Constant Progression:  Worsening Chronicity:  New Exacerbated by: swallowing. 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 Authorizing Provider  clindamycin  (CLEOCIN ) 300 MG capsule Take 1 capsule (300 mg total) by mouth 3 (three) times daily for 10 days. 04/14/24 04/24/24 Yes Danaysha Kirn, Raynell Moder, MD  etonogestrel  (NEXPLANON ) 68 MG IMPL implant 68 mg by Subdermal route once.    [provider]  ibuprofen  (ADVIL ) 800 MG tablet Take 1 tablet (800 mg total) by mouth every 8 (eight) hours as needed. 02/18/24   Sheryle Hamilton, DMD  levocetirizine (XYZAL ) 5 MG tablet Take 1 tablet (5 mg total) by mouth every evening. 03/01/24   Gayle Saddie FALCON, PA-C   metFORMIN  (GLUCOPHAGE ) 500 MG tablet Take 1 tablet (500 mg total) by mouth 2 (two) times daily with a meal. 03/01/24   Clapp, Kara F, PA-C  montelukast  (SINGULAIR ) 10 MG tablet Take 1 tablet (10 mg total) by mouth at bedtime. 03/01/24   Gayle Saddie FALCON, PA-C  Semaglutide ,0.25 or 0.5MG /DOS, (OZEMPIC , 0.25 OR 0.5 MG/DOSE,) 2 MG/3ML SOPN Inject 0.5 mg into the skin once a week. 03/01/24   Gayle Saddie FALCON, PA-C  triamcinolone  ointment (KENALOG ) 0.1 % Apply 1 Application topically 2 (two) times daily as needed (rash flare). Do not use on the face, neck, armpits or groin area. Do not use more than 3 weeks in a row. 11/04/22   Luke Orlan HERO, DO                                                                                                                                    Allergies Ambrosia artemisiifolia (ragweed) skin test and Amoxicillin  Review of Systems Review of Systems  HENT:  Positive for sore throat. Negative for sinus pain.   Musculoskeletal:  Positive for myalgias.  Neurological:  Negative for headaches.   As noted in HPI  Physical Exam Vital Signs  I have reviewed the triage vital signs BP 138/87 (BP Location: Right Arm)   Pulse (!) 116   Temp (!) 101.7 F (38.7 C) (Oral)   Resp 19   LMP 03/27/2024   SpO2 100%   Physical Exam Vitals reviewed.  Constitutional:      General: She is not in acute distress.    Appearance: She is well-developed. She is not diaphoretic.  HENT:     Head: Normocephalic and atraumatic.     Right Ear: External ear normal.     Left Ear: External ear normal.     Nose: Nose normal.     Mouth/Throat:     Tonsils: Tonsillar exudate present. No tonsillar abscesses. 2+ on the right. 2+ on the left.  Eyes:     General: No scleral icterus.    Conjunctiva/sclera: Conjunctivae normal.  Neck:     Trachea: Phonation normal.  Cardiovascular:     Rate and Rhythm: Normal rate and regular rhythm.  Pulmonary:     Effort: Pulmonary effort is normal. No respiratory  distress.     Breath sounds: No stridor.  Abdominal:     General: There is no distension.  Musculoskeletal:        General: Normal range of motion.     Cervical back: Normal range of motion.  Neurological:     Mental Status: She is alert and oriented to person, place, and time.  Psychiatric:        Behavior: Behavior normal.     ED Results and Treatments Labs (all labs ordered are listed, but only abnormal results are displayed) Labs Reviewed  GROUP A STREP BY PCR - Abnormal; Notable for the following components:      Result Value   Group A Strep by PCR DETECTED (*)    All other components within normal limits  RESP PANEL BY RT-PCR (RSV, FLU A&B, COVID)  [id number]                                                                                                                         EKG  EKG Interpretation Date/Time:    Ventricular Rate:    PR Interval:    QRS Duration:    QT Interval:    QTC Calculation:   R Axis:      Text Interpretation:         Radiology DG Chest 2 View Result Date: 04/13/2024 CLINICAL DATA:  cough EXAM: CHEST - 2 VIEW COMPARISON:  Sep 15, 2020 FINDINGS: No focal airspace consolidation, pleural effusion, or pneumothorax. No cardiomegaly.No acute fracture or destructive lesion. IMPRESSION: No acute cardiopulmonary abnormality. Electronically Signed   By: Rogelia Myers M.D.   On: 04/13/2024 22:41    Medications Ordered in ED Medications  clindamycin  (CLEOCIN ) capsule  300 mg (has no administration in time range)  dexamethasone  (DECADRON ) 10 MG/ML injection for Pediatric ORAL use 10 mg (has no administration in time range)  acetaminophen  (TYLENOL ) tablet 1,000 mg (1,000 mg Oral Given 04/14/24 0054)   Procedures Procedures  (including critical care time) Medical Decision Making / ED Course   Medical Decision Making Amount and/or Complexity of Data Reviewed Labs: ordered. Decision-making details documented in ED Course. Radiology: ordered and  independent interpretation performed. Decision-making details documented in ED Course.  Risk OTC drugs. Prescription drug management.    Work up consistent with strep pharyngitis. No PTA. No ludwig's or other deep tissue infection requiring imaging at this time.     Final Clinical Impression(s) / ED Diagnoses Final diagnoses:  Strep throat   The patient appears reasonably screened and/or stabilized for discharge and I doubt any other medical condition or other [facility] requiring further screening, evaluation, or treatment in the ED at this time. I have discussed the findings, Dx and Tx plan with the patient/family who expressed understanding and agree(s) with the plan. Discharge instructions discussed at length. The patient/family was given strict return precautions who verbalized understanding of the instructions. No further questions at time of discharge.  Disposition: Discharge  Condition: Good  ED Discharge Orders          Ordered    clindamycin  (CLEOCIN ) 300 MG capsule  3 times daily        04/14/24 0134             Follow Up: Gayle Saddie FALCON, PA-C [address] [place] [address] [phone]  Call  to schedule an appointment for close follow up    This chart was dictated using voice recognition software.  Despite best efforts to proofread,  errors can occur which can change the documentation meaning.    Trine Raynell Moder, MD 04/14/24 [phone]

## 2024-04-14 NOTE — Discharge Instructions (Addendum)
 For pain control you may take 1000 mg of acetaminophen (Tylenol) every 8 hours and/or 600 mg of Ibuprofen (Motrin, Advil, etc.) every 6-8 hours as needed.  Please limit acetaminophen (Tylenol) to 4000 mg and Ibuprofen (Motrin, Advil, etc.) to 2400 mg for a 24hr period. Please note that other over-the-counter medicine may contain acetaminophen or ibuprofen as a component of their ingredients.

## 2024-08-23 ENCOUNTER — Other Ambulatory Visit

## 2024-08-30 ENCOUNTER — Ambulatory Visit
